# Patient Record
Sex: Male | Born: 1980
Health system: Southern US, Community
[De-identification: ages and names within clinical notes are randomized; demographics above are authoritative.]

## PROBLEM LIST (undated history)

## (undated) DIAGNOSIS — S022XXA Fracture of nasal bones, initial encounter for closed fracture: Secondary | ICD-10-CM

## (undated) HISTORY — DX: Fracture of nasal bones, initial encounter for closed fracture: S02.2XXA

## (undated) HISTORY — PX: NASAL FRACTURE SURGERY: SHX718

## (undated) HISTORY — PX: HAND SURGERY: SHX662

---

## 2018-03-10 LAB — BASIC METABOLIC PANEL
BUN: 16 (ref 4–21)
Creatinine: 1.3 (ref 0.6–1.3)
Glucose: 66
Potassium: 5.5 — AB (ref 3.4–5.3)
Sodium: 146 (ref 137–147)

## 2018-03-10 LAB — CBC AND DIFFERENTIAL
HCT: 50 (ref 41–53)
Hemoglobin: 15.8 (ref 13.5–17.5)
Neutrophils Absolute: 3
PLATELETS: 225 (ref 150–399)
WBC: 8.1

## 2018-03-10 LAB — HEPATIC FUNCTION PANEL
ALK PHOS: 89 (ref 25–125)
ALT: 34 (ref 10–40)
AST: 19 (ref 14–40)
Bilirubin, Total: 0.2

## 2018-03-10 LAB — HEMOGLOBIN A1C: Hemoglobin A1C: 5.1

## 2018-03-10 LAB — LIPID PANEL
Cholesterol: 175 (ref 0–200)
HDL: 57 (ref 35–70)
LDL Cholesterol: 89
Triglycerides: 144 (ref 40–160)

## 2018-03-10 LAB — TSH: TSH: 2.41 (ref 0.41–5.90)

## 2018-10-18 ENCOUNTER — Ambulatory Visit: Payer: Commercial Managed Care - PPO | Admitting: Family Medicine

## 2018-10-18 ENCOUNTER — Encounter: Payer: Self-pay | Admitting: Family Medicine

## 2018-10-18 VITALS — BP 120/76 | HR 58 | Temp 98.4°F | Ht 73.0 in | Wt 180.3 lb

## 2018-10-18 DIAGNOSIS — J329 Chronic sinusitis, unspecified: Secondary | ICD-10-CM | POA: Diagnosis not present

## 2018-10-18 DIAGNOSIS — J3089 Other allergic rhinitis: Secondary | ICD-10-CM | POA: Insufficient documentation

## 2018-10-18 DIAGNOSIS — Z86711 Personal history of pulmonary embolism: Secondary | ICD-10-CM

## 2018-10-18 DIAGNOSIS — J4520 Mild intermittent asthma, uncomplicated: Secondary | ICD-10-CM

## 2018-10-18 DIAGNOSIS — M5030 Other cervical disc degeneration, unspecified cervical region: Secondary | ICD-10-CM | POA: Insufficient documentation

## 2018-10-18 DIAGNOSIS — J45909 Unspecified asthma, uncomplicated: Secondary | ICD-10-CM | POA: Insufficient documentation

## 2018-10-18 DIAGNOSIS — Z79899 Other long term (current) drug therapy: Secondary | ICD-10-CM | POA: Insufficient documentation

## 2018-10-18 DIAGNOSIS — J309 Allergic rhinitis, unspecified: Secondary | ICD-10-CM | POA: Diagnosis not present

## 2018-10-18 NOTE — Progress Notes (Signed)
New patient office visit note:  Impression and Recommendations:    1. Chronic allergic rhinitis   2. Chronic sinusitis, unspecified location   3. Environmental and seasonal allergies- DUST MITES   4. Mild intermittent reactive airway disease without complication   5. Degenerative disc disease, cervical   6. Personal history of PE (pulmonary embolism)-  postoperative   7. High risk medications (not anticoagulants) long-term use-   prednisone per ENT     Hx of asthma:  -Patient has an inhaler, Breo, and singulair that he uses -Patient has multiple air purifiers within his home.  -He is stable at this time  Hx of Chronic sinusitis:  -Patient has been evaluated with ENT and Allergist in [place] and is on chronic prednisone -Patient will be referred to ENT specialist   Hx of bilateral PE -S/p surgery to left hand for surgical repair s/p fracture -anticoagulation workup completed, patient on blood thinner total of 6 months.  -No issues since.   Advised the patient to bring in labs from work (will be drawn in March 2020).   Follow up in 3-4 months to review labs   Education and routine counseling performed. Handouts provided.  Gross side effects, risk and benefits, and alternatives of medications discussed with patient.  Patient is aware that all medications have potential side effects and we are unable to predict every side effect or drug-drug interaction that may occur.  Expresses verbal understanding and consents to current therapy plan and treatment regimen.  Return for Follow-up in future for review of blood work done at [facility].  Please see AVS handed out to patient at the end of our visit for further patient instructions/ counseling done pertaining to today's office visit.    Note:  This document was prepared using Dragon voice recognition software and may include unintentional dictation errors.   This document serves as a record of services personally performed by  Thomasene Lot, DO. It was created on her behalf by Chestine Spore, a trained medical scribe. The creation of this record is based on the scribe's personal observations and the provider's statements to them.   I have reviewed the above medical documentation for accuracy and completeness and I concur.  Thomasene Lot, DO 10/20/2018 9:37 PM      ---------------------------------------------------------------------------------------------------------------------------------------------------------------------------------------------    Subjective:    Chief complaint:   Chief Complaint  Patient presents with  . Establish Care     HPI: Darrell Nguyen is a pleasant 38 y.o. male who presents to [facility] today to review their medical history with me and establish care.   I asked the patient to review their chronic problem list with me to ensure everything was updated and accurate.    All recent office visits with other providers, any medical records that patient brought in etc  - I reviewed today.     We asked pt to get [place] their medical records from [facility] providers/ specialists that they had seen within the past 3-5 years- if they are in private practice and/or do not work for [employer], [facility], [facility], [facility] or [facility] owned practice.  Told them to call their specialists to clarify this if they are not sure.   PMHx: He takes prednisone for chronic sinus infections with multiple surgeries. His ENT surgeon is in [place] and he would like a referral locally. His daughter is 42 months old and he is married. He has DDD in C6-C7. He takes [employer]  for his breathing issues (environmental seasonal allergies and RAD). He does have several air purifiers at home. He has a rescue inhaler and he does nasal rinses. 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 He has a hx of PE s/p his left hand fracture surgical repair, it  was attributed to him being taking off blood thinners early. He was on blood thinners for 6 months and he hasn't had any issues since then.   PSHx:  He has had several surgeries due to his hx of chronic sinusitis.   SHx:  He works at the [employer], which he loves. He hasn't smoked cigarettes. He consumes Bud select beer (up to 6 beers a week). He consumes 1 soda daily. He runs 3 times a week running on an elliptical for a hour. He has one brother.   FMHx:  His mother had breast cancer and his uncle had prostate cancer.    Wt Readings from Last 3 Encounters:  10/18/18 180 lb 4.8 oz (81.8 kg)   BP Readings from Last 3 Encounters:  10/18/18 120/76   Pulse Readings from Last 3 Encounters:  10/18/18 (!) 58   BMI Readings from Last 3 Encounters:  10/18/18 23.79 kg/m    Patient Care Team    Relationship Specialty Notifications Start End  Thomasene Lot, DO PCP - General Family Medicine  10/18/18     Patient Active Problem List   Diagnosis Date Noted  . Chronic allergic rhinitis 10/18/2018  . Chronic sinusitis 10/18/2018  . Environmental and seasonal allergies- DUST MITES 10/18/2018  . Reactive airway disease 10/18/2018  . Degenerative disc disease, cervical 10/18/2018  . Personal history of PE (pulmonary embolism)-  postoperative 10/18/2018  . High risk medications (not anticoagulants) long-term use-   prednisone per ENT 10/18/2018       As reported by pt:  Past Medical History:  Diagnosis Date  . Nasal fracture      Past Surgical History:  Procedure Laterality Date  . HAND SURGERY    . NASAL FRACTURE SURGERY       Family History  Problem Relation Age of Onset  . Breast cancer Mother   . Liver cancer Maternal Grandfather      Social History   Substance and Sexual Activity  Drug Use Never     Social History   Substance and Sexual Activity  Alcohol Use Yes  . Alcohol/week: 6.0 standard drinks  . Types: 6 Standard drinks or equivalent per  week     Social History   Tobacco Use  Smoking Status Never Smoker  Smokeless Tobacco Never Used     Current Meds  Medication Sig  . fluticasone furoate-vilanterol (BREO ELLIPTA) 100-25 MCG/INH AEPB Inhale 1 puff into the lungs daily.  . montelukast (SINGULAIR) 10 MG tablet Take 10 mg by mouth at bedtime.  . predniSONE (DELTASONE) 10 MG tablet Take 10 mg by mouth daily with breakfast.  . Timmothy Sours 93 MCG/ACT EXHU Place 1 spray into both nostrils 2 (two) times daily.    Allergies: Augmentin [amoxicillin-pot clavulanate]   ROS      Objective:   Blood pressure 120/76, pulse (!) 58, temperature 98.4 F (36.9 C), height 6\' 1"  (1.854 m), weight 180 lb 4.8 oz (81.8 kg), SpO2 98 %. Body mass index is 23.79 kg/m. General: Well Developed, well nourished, and in no acute distress.  Neuro: Alert and oriented x3, extra-ocular muscles intact, sensation grossly intact.  HEENT:[place]/AT, PERRLA, neck supple, No carotid bruits Skin: no gross rashes  Cardiac: Regular rate and rhythm Respiratory: Essentially clear to auscultation bilaterally. Not using accessory muscles, speaking in full sentences.  Abdominal: not grossly distended Musculoskeletal: Ambulates w/o diff, FROM * 4 ext.  Vasc: less 2 sec cap RF, warm and pink  Psych:  No HI/SI, judgement and insight good, Euthymic mood. Full Affect.    No results found for this or any previous visit (from the past 2160 hour(s)).

## 2018-10-18 NOTE — Patient Instructions (Addendum)
Great website for CADR [employer] Delivery Rate) for air purifiers: If you search Asthma and allergy + CADR ratings of air purifiers- that would be helpful   Please realize, EXERCISE IS MEDICINE!  -  American Heart Association [facility]) guidelines for exercise : If you are in good health, without any medical conditions, you should engage in 150-300 minutes of moderate intensity aerobic activity per week.  This means you should be huffing and puffing throughout your workout.   Engaging in regular exercise will improve brain function and memory, as well as improve mood, boost immune system and help with weight management.  As well as the other, more well-known effects of exercise such as decreasing blood sugar levels, decreasing blood pressure,  and decreasing bad cholesterol levels/ increasing good cholesterol levels.     -  The AHA strongly endorses consumption of a diet that contains a variety of foods from all the food categories with an emphasis on fruits and vegetables; fat-free and low-fat dairy products; cereal and grain products; legumes and nuts; and fish, poultry, and/or extra lean meats.    Excessive food intake, especially of foods high in saturated and trans fats, sugar, and salt, should be avoided.    Adequate water intake of roughly 1/2 of your weight in pounds, should equal the ounces of water per day you should drink.  So for instance, if you're 200 pounds, that would be 100 ounces of water per day.         Mediterranean Diet  Why follow it? Research shows  Those who follow the Mediterranean diet have a reduced risk of heart disease   The diet is associated with a reduced incidence of Parkinson's and Alzheimer's diseases  People following the diet may have longer life expectancies and lower rates of chronic diseases   The Dietary Guidelines for Americans recommends the Mediterranean diet as an eating plan to promote health and prevent disease  What Is the Mediterranean Diet?    Healthy eating plan based on typical foods and recipes of Mediterranean-style cooking  The diet is primarily a plant based diet; these foods should make up a majority of meals   Starches - Plant based foods should make up a majority of meals - They are an important sources of vitamins, minerals, energy, antioxidants, and fiber - Choose whole grains, foods high in fiber and minimally processed items  - Typical grain sources include wheat, oats, barley, corn, brown rice, bulgar, farro, millet, polenta, couscous  - Various types of beans include chickpeas, lentils, fava beans, black beans, white beans   Fruits  Veggies - Large quantities of antioxidant rich fruits & veggies; 6 or more servings  - Vegetables can be eaten raw or lightly drizzled with oil and cooked  - Vegetables common to the traditional Mediterranean Diet include: artichokes, arugula, beets, broccoli, brussel sprouts, cabbage, carrots, celery, collard greens, cucumbers, eggplant, kale, leeks, lemons, lettuce, mushrooms, okra, onions, peas, peppers, potatoes, pumpkin, radishes, rutabaga, shallots, spinach, sweet potatoes, turnips, zucchini - Fruits common to the Mediterranean Diet include: apples, apricots, avocados, cherries, clementines, dates, figs, grapefruits, grapes, melons, nectarines, oranges, peaches, pears, pomegranates, strawberries, tangerines  Fats - Replace butter and margarine with healthy oils, such as olive oil, canola oil, and tahini  - Limit nuts to no more than a handful a day  - Nuts include walnuts, almonds, pecans, pistachios, pine nuts  - Limit or avoid candied, honey roasted or heavily salted nuts - Olives are central to the Mediterranean  diet - can be eaten whole or used in a variety of dishes   Meats Protein - Limiting red meat: no more than a few times a month - When eating red meat: choose lean cuts and keep the portion to the size of deck of cards - Eggs: approx. 0 to 4 times a week  - Fish and lean  poultry: at least 2 a week  - Healthy protein sources include, chicken, [place], lean beef, lamb - Increase intake of seafood such as tuna, salmon, trout, mackerel, shrimp, scallops - Avoid or limit high fat processed meats such as sausage and bacon  Dairy - Include moderate amounts of low fat dairy products  - Focus on healthy dairy such as fat free yogurt, skim milk, low or reduced fat cheese - Limit dairy products higher in fat such as whole or 2% milk, cheese, ice cream  Alcohol - Moderate amounts of red wine is ok  - No more than 5 oz daily for women (all ages) and men older than age 36  - No more than 10 oz of wine daily for men younger than 2265  Other - Limit sweets and other desserts  - Use herbs and spices instead of salt to flavor foods  - Herbs and spices common to the traditional Mediterranean Diet include: basil, bay leaves, chives, cloves, cumin, fennel, garlic, lavender, marjoram, mint, oregano, parsley, pepper, rosemary, sage, savory, sumac, tarragon, thyme   Its not just a diet, its a lifestyle:   The Mediterranean diet includes lifestyle factors typical of those in the region   Foods, drinks and meals are best eaten with others and savored  Daily physical activity is important for overall good health  This could be strenuous exercise like running and aerobics  This could also be more leisurely activities such as walking, housework, yard-work, or taking the stairs  Moderation is the key; a balanced and healthy diet accommodates most foods and drinks  Consider portion sizes and frequency of consumption of certain foods   Meal Ideas & Options:   Breakfast:  o Whole wheat toast or whole wheat English muffins with peanut butter & hard boiled egg o Steel cut oats topped with apples & cinnamon and skim milk  o Fresh fruit: banana, strawberries, melon, berries, peaches  o Smoothies: strawberries, bananas, greek yogurt, peanut butter o Low fat greek yogurt with  blueberries and granola  o Egg white omelet with spinach and mushrooms o Breakfast couscous: whole wheat couscous, apricots, skim milk, cranberries   Sandwiches:  o Hummus and grilled vegetables (peppers, zucchini, squash) on whole wheat bread   o Grilled chicken on whole wheat pita with lettuce, tomatoes, cucumbers or tzatziki  o Tuna salad on whole wheat bread: tuna salad made with greek yogurt, olives, red peppers, capers, green onions o Garlic rosemary lamb pita: lamb sauted with garlic, rosemary, salt & pepper; add lettuce, cucumber, greek yogurt to pita - flavor with lemon juice and black pepper   Seafood:  o Mediterranean grilled salmon, seasoned with garlic, basil, parsley, lemon juice and black pepper o Shrimp, lemon, and spinach whole-grain pasta salad made with low fat greek yogurt  o Seared scallops with lemon orzo  o Seared tuna steaks seasoned salt, pepper, coriander topped with tomato mixture of olives, tomatoes, olive oil, minced garlic, parsley, green onions and cappers   Meats:  o Herbed greek chicken salad with kalamata olives, cucumber, feta  o Red bell peppers stuffed with spinach, bulgur, lean ground beef (  or lentils) & topped with feta   o Kebabs: skewers of chicken, tomatoes, onions, zucchini, squash  o [place] burgers: made with red onions, mint, dill, lemon juice, feta cheese topped with roasted red peppers  Vegetarian o Cucumber salad: cucumbers, artichoke hearts, celery, red onion, feta cheese, tossed in olive oil & lemon juice  o Hummus and whole grain pita points with a greek salad (lettuce, tomato, feta, olives, cucumbers, red onion) o Lentil soup with celery, carrots made with vegetable broth, garlic, salt and pepper  o Tabouli salad: parsley, bulgur, mint, scallions, cucumbers, tomato, radishes, lemon juice, olive oil, salt and pepper.

## 2018-10-22 DIAGNOSIS — J4530 Mild persistent asthma, uncomplicated: Secondary | ICD-10-CM | POA: Diagnosis not present

## 2018-10-22 DIAGNOSIS — J328 Other chronic sinusitis: Secondary | ICD-10-CM | POA: Diagnosis not present

## 2018-10-22 DIAGNOSIS — J339 Nasal polyp, unspecified: Secondary | ICD-10-CM | POA: Diagnosis not present

## 2019-01-17 DIAGNOSIS — J339 Nasal polyp, unspecified: Secondary | ICD-10-CM | POA: Insufficient documentation

## 2019-01-17 DIAGNOSIS — J329 Chronic sinusitis, unspecified: Secondary | ICD-10-CM | POA: Diagnosis not present

## 2019-01-17 DIAGNOSIS — J328 Other chronic sinusitis: Secondary | ICD-10-CM | POA: Diagnosis not present

## 2019-05-27 ENCOUNTER — Other Ambulatory Visit: Payer: Self-pay

## 2019-05-27 ENCOUNTER — Ambulatory Visit (INDEPENDENT_AMBULATORY_CARE_PROVIDER_SITE_OTHER): Payer: Commercial Managed Care - PPO | Admitting: Podiatry

## 2019-05-27 ENCOUNTER — Ambulatory Visit (INDEPENDENT_AMBULATORY_CARE_PROVIDER_SITE_OTHER): Payer: Commercial Managed Care - PPO

## 2019-05-27 VITALS — Temp 98.4°F

## 2019-05-27 DIAGNOSIS — M79672 Pain in left foot: Secondary | ICD-10-CM

## 2019-05-27 DIAGNOSIS — M722 Plantar fascial fibromatosis: Secondary | ICD-10-CM

## 2019-05-27 DIAGNOSIS — M216X9 Other acquired deformities of unspecified foot: Secondary | ICD-10-CM

## 2019-05-27 DIAGNOSIS — M79671 Pain in right foot: Secondary | ICD-10-CM

## 2019-05-27 DIAGNOSIS — M7752 Other enthesopathy of left foot: Secondary | ICD-10-CM

## 2019-05-27 NOTE — Patient Instructions (Signed)

## 2019-05-27 NOTE — Progress Notes (Signed)
  Subjective:  Patient ID: Darrell Nguyen, male    DOB: 10-08-1980,  MRN: [id number]  Chief Complaint  Patient presents with  . Foot Pain    Pt states bilateral arch weakness with left being worse than right. Pt states activity worsens it. Pt states needs high arch support to walk.    38 y.o. male presents with the above complaint. Hx as above. Has tried all sorts of inserts without relief.  Review of Systems: Negative except as noted in the HPI. Denies N/V/F/Ch.  Past Medical History:  Diagnosis Date  . Nasal fracture     Current Outpatient Medications:  .  budesonide (PULMICORT) 0.5 MG/2ML nebulizer solution, Use one vial twice daily, in sinus rinse irrigation as directed., Disp: , Rfl:  .  dupilumab (DUPIXENT) 300 MG/2ML prefilled syringe, , Disp: , Rfl:  .  fluticasone furoate-vilanterol (BREO ELLIPTA) 100-25 MCG/INH AEPB, Inhale 1 puff into the lungs daily., Disp: , Rfl:  .  montelukast (SINGULAIR) 10 MG tablet, Take 10 mg by mouth at bedtime., Disp: , Rfl:  .  oxyCODONE-acetaminophen (PERCOCET/ROXICET) 5-325 MG tablet, , Disp: , Rfl:  .  predniSONE (DELTASONE) 10 MG tablet, Take 10 mg by mouth daily with breakfast., Disp: , Rfl:  .  XHANCE 93 MCG/ACT EXHU, Place 1 spray into both nostrils 2 (two) times daily., Disp: , Rfl:   Social History   Tobacco Use  Smoking Status Never Smoker  Smokeless Tobacco Never Used    Allergies  Allergen Reactions  . Augmentin [Amoxicillin-Pot Clavulanate] Rash   Objective:   Vitals:   05/27/19 1352  Temp: 98.4 F (36.9 C)   There is no height or weight on file to calculate BMI. Constitutional Well developed. Well nourished.  Vascular Dorsalis pedis pulses palpable bilaterally. Posterior tibial pulses palpable bilaterally. Capillary refill normal to all digits.  No cyanosis or clubbing noted. Pedal hair growth normal.  Neurologic Normal speech. Oriented to person, place, and time. Epicritic sensation to light touch grossly  present bilaterally.  Dermatologic Nails well groomed and normal in appearance. No open wounds. No skin lesions.  Orthopedic: Normal joint ROM without pain or crepitus bilaterally. No visible deformities. Tender to palpation at the calcaneal tuber left. No pain with calcaneal squeeze bilaterally. Ankle ROM diminished range of motion bilaterally. Silfverskiold Test: positive bilaterally.   Radiographs: Taken and reviewed. No acute fractures or dislocations. No evidence of stress fracture.  Plantar heel spur absent. Posterior heel spur absent.   Assessment:   1. Plantar fasciitis   2. Arch pain of left foot   3. Arch pain, right   4. Equinus deformity of foot   5. Bursitis of heel, left    Plan:  Patient was evaluated and treated and all questions answered.  Plantar Fasciitis, bilaterally - XR reviewed as above.  - Educated on icing and stretching. Instructions given.  - Injection delivered to the plantar fascia as below. - DME: PF brace x2   Procedure: Injection Tendon/Ligament Location: Left plantar fascia at the glabrous junction; medial approach. Skin Prep: alcohol Injectate: 1 cc 0.5% marcaine plain, 1 cc dexamethasone phosphate, 0.5 cc kenalog 10. Disposition: Patient tolerated procedure well. Injection site dressed with a band-aid.  Return in about 3 weeks (around 06/17/2019) for Plantar fasciitis.

## 2019-05-30 ENCOUNTER — Other Ambulatory Visit: Payer: Self-pay | Admitting: Podiatry

## 2019-05-30 DIAGNOSIS — M722 Plantar fascial fibromatosis: Secondary | ICD-10-CM

## 2019-06-23 ENCOUNTER — Ambulatory Visit: Payer: Commercial Managed Care - PPO | Admitting: Podiatry

## 2019-07-08 ENCOUNTER — Other Ambulatory Visit: Payer: Self-pay

## 2019-07-08 ENCOUNTER — Ambulatory Visit (INDEPENDENT_AMBULATORY_CARE_PROVIDER_SITE_OTHER): Payer: Commercial Managed Care - PPO | Admitting: Podiatry

## 2019-07-08 DIAGNOSIS — M79671 Pain in right foot: Secondary | ICD-10-CM | POA: Diagnosis not present

## 2019-07-08 DIAGNOSIS — M79672 Pain in left foot: Secondary | ICD-10-CM

## 2019-07-08 DIAGNOSIS — M722 Plantar fascial fibromatosis: Secondary | ICD-10-CM | POA: Diagnosis not present

## 2019-07-08 NOTE — Progress Notes (Signed)
  Subjective:  Patient ID: Darrell Nguyen, male    DOB: 1981/02/08,  MRN: [id number]  Chief Complaint  Patient presents with  . Plantar Fasciitis    Pt states PF Braces are effective on left, right is uncomfortable, and pt has difficulty putting PF braces into his work shoes. Pt states his primary discomfort/weakness is in his plantar midfoot bilateral.    38 y.o. male presents with the above complaint. Hx as above.    Review of Systems: Negative except as noted in the HPI. Denies N/V/F/Ch.  Past Medical History:  Diagnosis Date  . Nasal fracture     Current Outpatient Medications:  .  budesonide (PULMICORT) 0.5 MG/2ML nebulizer solution, Use one vial twice daily, in sinus rinse irrigation as directed., Disp: , Rfl:  .  dupilumab (DUPIXENT) 300 MG/2ML prefilled syringe, , Disp: , Rfl:  .  fluticasone furoate-vilanterol (BREO ELLIPTA) 100-25 MCG/INH AEPB, Inhale 1 puff into the lungs daily., Disp: , Rfl:  .  montelukast (SINGULAIR) 10 MG tablet, Take 10 mg by mouth at bedtime., Disp: , Rfl:  .  oxyCODONE-acetaminophen (PERCOCET/ROXICET) 5-325 MG tablet, , Disp: , Rfl:  .  predniSONE (DELTASONE) 10 MG tablet, Take 10 mg by mouth daily with breakfast., Disp: , Rfl:  .  XHANCE 93 MCG/ACT EXHU, Place 1 spray into both nostrils 2 (two) times daily., Disp: , Rfl:   Social History   Tobacco Use  Smoking Status Never Smoker  Smokeless Tobacco Never Used    Allergies  Allergen Reactions  . Augmentin [Amoxicillin-Pot Clavulanate] Rash   Objective:   There were no vitals filed for this visit. There is no height or weight on file to calculate BMI. Constitutional Well developed. Well nourished.  Vascular Dorsalis pedis pulses palpable bilaterally. Posterior tibial pulses palpable bilaterally. Capillary refill normal to all digits.  No cyanosis or clubbing noted. Pedal hair growth normal.  Neurologic Normal speech. Oriented to person, place, and time. Epicritic sensation to light  touch grossly present bilaterally.  Dermatologic Nails well groomed and normal in appearance. No open wounds. No skin lesions.  Orthopedic: Normal joint ROM without pain or crepitus bilaterally. No visible deformities. No tenderness to palpation at the calcaneal tuber left. No pain with calcaneal squeeze bilaterally. Ankle ROM diminished range of motion bilaterally. Silfverskiold Test: positive bilaterally.   Radiographs: None   Assessment:   1. Plantar fasciitis   2. Arch pain of left foot   3. Arch pain, right    Plan:  Patient was evaluated and treated and all questions answered.  Plantar Fasciitis, bilaterally - Hold off further injection - Continue stretching and icing - Cast for CMOs. Will check benefits. Will need to fit in his steel toed boots.  No follow-ups on file.

## 2019-07-16 ENCOUNTER — Emergency Department (HOSPITAL_COMMUNITY): Payer: Commercial Managed Care - PPO

## 2019-07-16 ENCOUNTER — Encounter (HOSPITAL_COMMUNITY): Payer: Self-pay

## 2019-07-16 ENCOUNTER — Other Ambulatory Visit: Payer: Self-pay

## 2019-07-16 ENCOUNTER — Inpatient Hospital Stay (HOSPITAL_COMMUNITY)
Admission: EM | Admit: 2019-07-16 | Discharge: 2019-07-18 | DRG: 176 | Disposition: A | Discharge status: Home / Self Care | Payer: Commercial Managed Care - PPO | Attending: Internal Medicine | Admitting: Internal Medicine

## 2019-07-16 DIAGNOSIS — I2693 Single subsegmental pulmonary embolism without acute cor pulmonale: Secondary | ICD-10-CM | POA: Diagnosis not present

## 2019-07-16 DIAGNOSIS — I2699 Other pulmonary embolism without acute cor pulmonale: Secondary | ICD-10-CM | POA: Diagnosis present

## 2019-07-16 DIAGNOSIS — J329 Chronic sinusitis, unspecified: Secondary | ICD-10-CM | POA: Diagnosis present

## 2019-07-16 DIAGNOSIS — Z86711 Personal history of pulmonary embolism: Secondary | ICD-10-CM

## 2019-07-16 DIAGNOSIS — Z23 Encounter for immunization: Secondary | ICD-10-CM

## 2019-07-16 DIAGNOSIS — Z803 Family history of malignant neoplasm of breast: Secondary | ICD-10-CM

## 2019-07-16 DIAGNOSIS — Z7952 Long term (current) use of systemic steroids: Secondary | ICD-10-CM

## 2019-07-16 DIAGNOSIS — J4530 Mild persistent asthma, uncomplicated: Secondary | ICD-10-CM | POA: Diagnosis present

## 2019-07-16 DIAGNOSIS — Z20828 Contact with and (suspected) exposure to other viral communicable diseases: Secondary | ICD-10-CM | POA: Diagnosis present

## 2019-07-16 LAB — HIV ANTIBODY (ROUTINE TESTING W REFLEX): HIV Screen 4th Generation wRfx: NONREACTIVE

## 2019-07-16 LAB — CBC WITH DIFFERENTIAL/PLATELET
Abs Immature Granulocytes: 0.05 10*3/uL (ref 0.00–0.07)
Basophils Absolute: 0 10*3/uL (ref 0.0–0.1)
Basophils Relative: 1 %
Eosinophils Absolute: 0.4 10*3/uL (ref 0.0–0.5)
Eosinophils Relative: 4 %
HCT: 47.6 % (ref 39.0–52.0)
Hemoglobin: 16.1 g/dL (ref 13.0–17.0)
Immature Granulocytes: 1 %
Lymphocytes Relative: 15 %
Lymphs Abs: 1.3 10*3/uL (ref 0.7–4.0)
MCH: 31.1 pg (ref 26.0–34.0)
MCHC: 33.8 g/dL (ref 30.0–36.0)
MCV: 92.1 fL (ref 80.0–100.0)
Monocytes Absolute: 0.8 10*3/uL (ref 0.1–1.0)
Monocytes Relative: 9 %
Neutro Abs: 6.2 10*3/uL (ref 1.7–7.7)
Neutrophils Relative %: 70 %
Platelets: 220 10*3/uL (ref 150–400)
RBC: 5.17 MIL/uL (ref 4.22–5.81)
RDW: 13.4 % (ref 11.5–15.5)
WBC: 8.9 10*3/uL (ref 4.0–10.5)
nRBC: 0 % (ref 0.0–0.2)

## 2019-07-16 LAB — BASIC METABOLIC PANEL
Anion gap: 10 (ref 5–15)
BUN: 11 mg/dL (ref 6–20)
CO2: 24 mmol/L (ref 22–32)
Calcium: 9 mg/dL (ref 8.9–10.3)
Chloride: 106 mmol/L (ref 98–111)
Creatinine, Ser: 1.15 mg/dL (ref 0.61–1.24)
GFR calc Af Amer: >60 mL/min (ref >60)
GFR calc non Af Amer: >60 mL/min (ref >60)
Glucose, Bld: 89 mg/dL (ref 70–99)
Potassium: 3.6 mmol/L (ref 3.5–5.1)
Sodium: 140 mmol/L (ref 135–145)

## 2019-07-16 LAB — APTT: aPTT: 26 seconds (ref 24–36)

## 2019-07-16 LAB — TROPONIN I (HIGH SENSITIVITY)
Troponin I (High Sensitivity): 6 ng/L (ref <18)
Troponin I (High Sensitivity): 6 ng/L (ref <18)

## 2019-07-16 LAB — PROTIME-INR
INR: 1 (ref 0.8–1.2)
Prothrombin Time: 13 seconds (ref 11.4–15.2)

## 2019-07-16 LAB — SARS CORONAVIRUS 2 (TAT 6-24 HRS): SARS Coronavirus 2: NEGATIVE

## 2019-07-16 LAB — HEPARIN LEVEL (UNFRACTIONATED): Heparin Unfractionated: 0.87 IU/mL — ABNORMAL HIGH (ref 0.30–0.70)

## 2019-07-16 MED ORDER — HEPARIN (PORCINE) 25000 UT/250ML-% IV SOLN
1200.0000 [IU]/h | INTRAVENOUS | Status: DC
  Administered 2019-07-16: 14:00:00 1350 [IU]/h via INTRAVENOUS
  Administered 2019-07-17 – 2019-07-18 (×2): 1200 [IU]/h via INTRAVENOUS
  Filled 2019-07-16 (×3): qty 250

## 2019-07-16 MED ORDER — ACETAMINOPHEN 325 MG PO TABS: 650.0000 mg | ORAL_TABLET | Freq: Four times a day (QID) | ORAL | Status: DC | PRN

## 2019-07-16 MED ORDER — ACETAMINOPHEN 650 MG RE SUPP: 650.0000 mg | Freq: Four times a day (QID) | RECTAL | Status: DC | PRN

## 2019-07-16 MED ORDER — VITAMIN D 25 MCG (1000 UNIT) PO TABS
2000.0000 [IU] | ORAL_TABLET | Freq: Every morning | ORAL | Status: DC
  Administered 2019-07-16 – 2019-07-18 (×3): 2000 [IU] via ORAL
  Filled 2019-07-16 (×3): qty 2

## 2019-07-16 MED ORDER — MONTELUKAST SODIUM 10 MG PO TABS
10.0000 mg | ORAL_TABLET | Freq: Every morning | ORAL | Status: DC
  Administered 2019-07-16 – 2019-07-18 (×3): 10 mg via ORAL
  Filled 2019-07-16 (×3): qty 1

## 2019-07-16 MED ORDER — BUDESONIDE 0.5 MG/2ML IN SUSP: 0.5000 mg | RESPIRATORY_TRACT | Status: DC

## 2019-07-16 MED ORDER — ALBUTEROL SULFATE (2.5 MG/3ML) 0.083% IN NEBU
2.5000 mg | INHALATION_SOLUTION | Freq: Four times a day (QID) | RESPIRATORY_TRACT | Status: DC
  Filled 2019-07-16: qty 3

## 2019-07-16 MED ORDER — IOHEXOL 350 MG/ML SOLN
100.0000 mL | Freq: Once | INTRAVENOUS | Status: AC | PRN
  Administered 2019-07-16: 12:00:00 61 mL via INTRAVENOUS

## 2019-07-16 MED ORDER — ONDANSETRON HCL 4 MG PO TABS: 4.0000 mg | ORAL_TABLET | Freq: Four times a day (QID) | ORAL | Status: DC | PRN

## 2019-07-16 MED ORDER — DUPILUMAB 300 MG/2ML ~~LOC~~ SOSY: 300.0000 mg | PREFILLED_SYRINGE | SUBCUTANEOUS | Status: DC

## 2019-07-16 MED ORDER — MORPHINE SULFATE (PF) 2 MG/ML IV SOLN: 2.0000 mg | INTRAVENOUS | Status: DC | PRN

## 2019-07-16 MED ORDER — HYDROCODONE-ACETAMINOPHEN 5-325 MG PO TABS: 1.0000 | ORAL_TABLET | ORAL | Status: DC | PRN

## 2019-07-16 MED ORDER — HEPARIN BOLUS VIA INFUSION
5000.0000 [IU] | Freq: Once | INTRAVENOUS | Status: AC
  Administered 2019-07-16: 14:00:00 5000 [IU] via INTRAVENOUS
  Filled 2019-07-16: qty 5000

## 2019-07-16 MED ORDER — ONDANSETRON HCL 4 MG/2ML IJ SOLN: 4.0000 mg | Freq: Four times a day (QID) | INTRAMUSCULAR | Status: DC | PRN

## 2019-07-16 NOTE — ED Notes (Signed)
ED TO INPATIENT HANDOFF REPORT  ED Nurse Name and Phone #:  Zion Lint/Genevieve [phone]  S Name/Age/Gender Darrell Nguyen 38 y.o. male Room/Bed: 006C/006C  Code Status   Code Status: Full Code  Home/SNF/Other Home Patient oriented to: self, place, time and situation Is this baseline? Yes   Triage Complete: Triage complete  Chief Complaint back pain, hx of PE  Triage Note Patient reports mid left back pain, beneath left scapula, starting this morning around 0300. Pain feels similar to previous experience with PE, in 2017. Denies N/V/chills.    Allergies Allergies  Allergen Reactions  . Augmentin [Amoxicillin-Pot Clavulanate] Rash    Level of Care/Admitting Diagnosis ED Disposition    ED Disposition Condition Comment   Admit  Hospital Area: [facility] [[id number]]  Level of Care: Progressive [102]  Admit to Progressive based on following criteria: RESPIRATORY PROBLEMS hypoxemic/hypercapnic respiratory failure that is responsive to NIPPV (BiPAP) or High Flow Nasal Cannula (6-80 lpm). Frequent assessment/intervention, no > Q2 hrs < Q4 hrs, to maintain oxygenation and pulmonary hygiene.  Covid Evaluation: Asymptomatic Screening Protocol (No Symptoms)  Diagnosis: Pulmonary embolism (HCC) [241700]  Admitting Physician: Carron Curie [[id number]]  Attending Physician: HIJAZI, ALI Bai.Lain  Estimated length of stay: past midnight tomorrow  Certification:: I certify this patient will need inpatient services for at least 2 midnights  PT Class (Do Not Modify): Inpatient [101]  PT Acc Code (Do Not Modify): Private [1]       B Medical/Surgery History Past Medical History:  Diagnosis Date  . Nasal fracture    Past Surgical History:  Procedure Laterality Date  . HAND SURGERY    . NASAL FRACTURE SURGERY       A IV Location/Drains/Wounds Patient Lines/Drains/Airways Status   Active Line/Drains/Airways    Name:   Placement date:   Placement time:   Site:   Days:    Peripheral IV 07/16/19 Left Antecubital   07/16/19    1153    Antecubital   less than 1          Intake/Output Last 24 hours No intake or output data in the 24 hours ending 07/16/19 1449  Labs/Imaging Results for orders placed or performed during the hospital encounter of 07/16/19 (from the past 48 hour(s))  CBC with Differential     Status: None   Collection Time: 07/16/19 10:39 AM  Result Value Ref Range   WBC 8.9 4.0 - 10.5 K/uL   RBC 5.17 4.22 - 5.81 MIL/uL   Hemoglobin 16.1 13.0 - 17.0 g/dL   HCT 65.9 93.5 - 70.1 %   MCV 92.1 80.0 - 100.0 fL   MCH 31.1 26.0 - 34.0 pg   MCHC 33.8 30.0 - 36.0 g/dL   RDW 77.9 39.0 - 30.0 %   Platelets 220 150 - 400 K/uL   nRBC 0.0 0.0 - 0.2 %   Neutrophils Relative % 70 %   Neutro Abs 6.2 1.7 - 7.7 K/uL   Lymphocytes Relative 15 %   Lymphs Abs 1.3 0.7 - 4.0 K/uL   Monocytes Relative 9 %   Monocytes Absolute 0.8 0.1 - 1.0 K/uL   Eosinophils Relative 4 %   Eosinophils Absolute 0.4 0.0 - 0.5 K/uL   Basophils Relative 1 %   Basophils Absolute 0.0 0.0 - 0.1 K/uL   Immature Granulocytes 1 %   Abs Immature Granulocytes 0.05 0.00 - 0.07 K/uL    Comment: Performed at [facility], [address] [place], [place] [address]  Basic metabolic panel     Status: None   Collection Time: 07/16/19 10:39 AM  Result Value Ref Range   Sodium 140 135 - 145 mmol/L   Potassium 3.6 3.5 - 5.1 mmol/L   Chloride 106 98 - 111 mmol/L   CO2 24 22 - 32 mmol/L   Glucose, Bld 89 70 - 99 mg/dL   BUN 11 6 - 20 mg/dL   Creatinine, Ser 1.611.15 0.61 - 1.24 mg/dL   Calcium 9.0 8.9 - 09.610.3 mg/dL   GFR calc non Af Amer >60 >60 mL/min   GFR calc Af Amer >60 >60 mL/min   Anion gap 10 5 - 15    Comment: Performed at [facility], [address] [place], [place] [address]  Troponin I (High Sensitivity)     Status: None   Collection Time: 07/16/19 10:39 AM  Result Value Ref Range   Troponin I (High Sensitivity) 6 <18 ng/L    Comment: (NOTE) Elevated high  sensitivity troponin I (hsTnI) values and significant  changes across serial measurements may suggest ACS but many other  chronic and acute conditions are known to elevate hsTnI results.  Refer to the "Links" section for chest pain algorithms and additional  guidance. Performed at [facility], [address] [place], [place] [address]   Troponin I (High Sensitivity)     Status: None   Collection Time: 07/16/19 12:39 PM  Result Value Ref Range   Troponin I (High Sensitivity) 6 <18 ng/L    Comment: (NOTE) Elevated high sensitivity troponin I (hsTnI) values and significant  changes across serial measurements may suggest ACS but many other  chronic and acute conditions are known to elevate hsTnI results.  Refer to the "Links" section for chest pain algorithms and additional  guidance. Performed at [facility], [address] [place], [place] [address]   Protime-INR     Status: None   Collection Time: 07/16/19  1:17 PM  Result Value Ref Range   Prothrombin Time 13.0 11.4 - 15.2 seconds   INR 1.0 0.8 - 1.2    Comment: (NOTE) INR goal varies based on device and disease states. Performed at [facility], [address] [place], [place] [address]   APTT     Status: None   Collection Time: 07/16/19  1:17 PM  Result Value Ref Range   aPTT 26 24 - 36 seconds    Comment: Performed at [facility], [address] [place], [place] [address]   Dg Chest 2 View  Result Date: 07/16/2019 CLINICAL DATA:  38 year old male with a history of back pain EXAM: CHEST - 2 VIEW COMPARISON:  None. FINDINGS: Cardiomediastinal silhouette within normal limits in size and contour. No evidence of central vascular congestion. No pneumothorax or pleural effusion. No confluent airspace disease. No displaced fracture. IMPRESSION: Negative for acute cardiopulmonary disease Electronically Signed   By: Gilmer MorJaime  Wagner D.O.   On: 07/16/2019 11:22   Ct Angio Chest Pe W And/or Wo  Contrast  Result Date: 07/16/2019 CLINICAL DATA:  Left mid back pain.  History of PE. EXAM: CT ANGIOGRAPHY CHEST WITH CONTRAST TECHNIQUE: Multidetector CT imaging of the chest was performed using the standard protocol during bolus administration of intravenous contrast. Multiplanar CT image reconstructions and MIPs were obtained to evaluate the vascular anatomy. CONTRAST:  61mL OMNIPAQUE IOHEXOL 350 MG/ML SOLN COMPARISON:  None. FINDINGS: Cardiovascular: Heart size normal. No pericardial effusion. Satisfactory opacification of pulmonary arteries  noted, and there is no evidence of pulmonary emboli. There is good contrast opacification of the pulmonary arterial tree. There is a single segmental filling defect in posterior basal segment left lower lobe pulmonary artery consistent with acute PE. No stigmata of chronic pulmonary emboli. Adequate contrast opacification of the thoracic aorta with no evidence of dissection, aneurysm, or stenosis. There is classic 3-vessel brachiocephalic arch anatomy without proximal stenosis. No significant atheromatous change. Mediastinum/Nodes: No hilar or mediastinal adenopathy. Lungs/Pleura: No pleural effusion. No pneumothorax. Pleural-based wedge-like consolidation in the posterolateral left lower lobe possibly small pulmonary infarct. Subsegmental consolidation/atelectasis posteriorly in both lower lobes left greater than right. Lungs otherwise clear. Upper Abdomen: No acute findings Musculoskeletal: No chest wall abnormality. No acute or significant osseous findings. Review of the MIP images confirms the above findings. IMPRESSION: 1. POSITIVE for solitary segmental acute pulmonary embolus in the posterior basal segment left lower lobe, with possible small associated pulmonary infarct. Electronically Signed   By: Corlis Leak M.D.   On: 07/16/2019 12:35    Pending Labs Unresulted Labs (From admission, onward)    Start     Ordered   07/17/19 0500  Heparin level  (unfractionated)  Daily,   R     07/16/19 1342   07/17/19 0500  CBC  Daily,   R     07/16/19 1342   07/16/19 2000  Heparin level (unfractionated)  Once-Timed,   STAT     07/16/19 1432   07/16/19 1414  HIV Antibody (routine testing w rflx)  (HIV Antibody (Routine testing w reflex) panel)  Once,   STAT     07/16/19 1413   07/16/19 1336  Protein C activity  (Hypercoagulable Panel, Comprehensive (PNL))  Once,   STAT     07/16/19 1335   07/16/19 1336  Protein C, total  (Hypercoagulable Panel, Comprehensive (PNL))  Once,   STAT     07/16/19 1335   07/16/19 1336  Protein S activity  (Hypercoagulable Panel, Comprehensive (PNL))  Once,   STAT     07/16/19 1335   07/16/19 1336  Protein S, total  (Hypercoagulable Panel, Comprehensive (PNL))  Once,   STAT     07/16/19 1335   07/16/19 1336  Lupus anticoagulant panel  (Hypercoagulable Panel, Comprehensive (PNL))  Once,   STAT     07/16/19 1335   07/16/19 1336  Beta-2-glycoprotein i abs, IgG/M/A  (Hypercoagulable Panel, Comprehensive (PNL))  Once,   STAT     07/16/19 1335   07/16/19 1336  Homocysteine, serum  (Hypercoagulable Panel, Comprehensive (PNL))  Once,   STAT     07/16/19 1335   07/16/19 1336  Factor 5 leiden  (Hypercoagulable Panel, Comprehensive (PNL))  Once,   STAT     07/16/19 1335   07/16/19 1336  Prothrombin gene mutation  (Hypercoagulable Panel, Comprehensive (PNL))  Once,   STAT     07/16/19 1335   07/16/19 1336  Cardiolipin antibodies, IgG, IgM, IgA  (Hypercoagulable Panel, Comprehensive (PNL))  Once,   STAT     07/16/19 1335   07/16/19 1304  SARS CORONAVIRUS 2 (TAT 6-24 HRS) Nasopharyngeal Nasopharyngeal Swab  (Asymptomatic/Tier 2 Patients Labs)  Once,   STAT    Question Answer Comment  Is this test for diagnosis or screening Screening   Symptomatic for COVID-19 as defined by CDC No   Hospitalized for COVID-19 No   Admitted to ICU for COVID-19 No   Previously tested for COVID-19 No   Resident in a congregate (group) care  setting  No   Employed in healthcare setting No      07/16/19 1304          Vitals/Pain Today's Vitals   07/16/19 1317 07/16/19 1345 07/16/19 1430 07/16/19 1445  BP: 128/81 120/79 127/71 123/89  Pulse: 81 69 87 80  Resp: 16 20 18 13   Temp:      TempSrc:      SpO2: 96% 98% 100% 100%  Weight:      Height:      PainSc:        Isolation Precautions No active isolations  Medications Medications  heparin ADULT infusion 100 units/mL (25000 units/247mL sodium chloride 0.45%) (1,350 Units/hr Intravenous New Bag/Given 07/16/19 1419)  Vitamin D 2,000 Units (has no administration in time range)  budesonide (PULMICORT) nebulizer solution 0.5 mg (has no administration in time range)  montelukast (SINGULAIR) tablet 10 mg (has no administration in time range)  dupilumab (DUPIXENT) prefilled syringe 300 mg (has no administration in time range)  acetaminophen (TYLENOL) tablet 650 mg (has no administration in time range)    Or  acetaminophen (TYLENOL) suppository 650 mg (has no administration in time range)  HYDROcodone-acetaminophen (NORCO/VICODIN) 5-325 MG per tablet 1-2 tablet (has no administration in time range)  ondansetron (ZOFRAN) tablet 4 mg (has no administration in time range)    Or  ondansetron (ZOFRAN) injection 4 mg (has no administration in time range)  morphine 2 MG/ML injection 2 mg (has no administration in time range)  albuterol (PROVENTIL) (2.5 MG/3ML) 0.083% nebulizer solution 2.5 mg (has no administration in time range)  iohexol (OMNIPAQUE) 350 MG/ML injection 100 mL (61 mLs Intravenous Contrast Given 07/16/19 1213)  heparin bolus via infusion 5,000 Units (5,000 Units Intravenous Bolus from Bag 07/16/19 1419)    Mobility walks Low fall risk   Focused Assessments Pulmonary Assessment Handoff:  Lung sounds:   O2 Device: Room Air        R Recommendations: See Admitting Provider Note  Report given to:   Additional Notes:

## 2019-07-16 NOTE — ED Notes (Signed)
Patient returned from CT

## 2019-07-16 NOTE — Progress Notes (Signed)
ANTICOAGULATION CONSULT NOTE - Initial Consult  Pharmacy Consult for heparin Indication: pulmonary embolus   Patient Measurements: Height: 6\' 1"  (185.4 cm) Weight: 185 lb (83.9 kg) IBW/kg (Calculated) : 79.9 Heparin Dosing Weight: 83.9 kg  Vital Signs: Temp: 98.6 F (37 C) (11/07 1003) Temp Source: Oral (11/07 1003) BP: 128/81 (11/07 1317) Pulse Rate: 81 (11/07 1317)  Labs: Recent Labs    07/16/19 1039 07/16/19 1239  HGB 16.1  --   HCT 47.6  --   PLT 220  --   CREATININE 1.15  --   TROPONINIHS 6 6     Assessment: 38 yo male admitted with scapular pain. Pt has a history of a prior PE and stated this felt similar. CTA is positive for segmental PE in L lower lobe. CBC and Scr are normal. He will be placed on a heparin infusion and will undergo hypercoagulable workup.  Goal of Therapy:  Heparin level 0.3-0.7 units/ml Monitor platelets by anticoagulation protocol: Yes   Plan:  -Heparin bolus 5000 units x1 then 1350 units/hr -Daily HL, CBC -Check HL this evening   Harvel Quale 07/16/2019,1:39 PM

## 2019-07-16 NOTE — ED Notes (Signed)
Patient transported to CT 

## 2019-07-16 NOTE — ED Provider Notes (Signed)
[facility] EMERGENCY DEPARTMENT Provider Note   CSN: [id number] Arrival date & time: 07/16/19  7829    History   Chief Complaint Back Pain  HPI Darrell Nguyen is a 38 y.o. male with past medical history significant for asthma, PE who presents for evaluation of left scapular pain.  Patient states he woke up at 3:00 this morning with left-sided scapular pain he has not taken anything for this.  Patient states he has a sharp shooting pain which goes into his chest when he takes a deep breath. Does have history of PE which he thinks was thought related to a hand surgery which he had had previously.  Patient states he did have a surgery in June however none since.  He denies fever, chills, nausea, vomiting, cough, hemoptysis abdominal pain, diarrhea, dysuria, flank pain, recent injury or trauma, unilateral leg swelling, redness or warmth.  No recent anticoagulation.  He is unsure of clotting disorders.  Denies additional aggravating or alleviating factors.  Patient states pain also worse when he lays flat and improves when he sits up. Denies additional aggravating or alleviating factors.  States he has had some intermittent shortness of breath however has none currently.  She states "I am just nervous because this is what my prior blood clot felt like."  Was evaluated by telehealth earlier today and sent here for CT scan of his chest to rule out a blood clot.  Rates his current pain a 5/10.  He does not anything for pain at this time.  He describes his pain as sharp and shooting.  History obtained from patient and past medical records. No interpretor was used.   PCP- Opalski    HPI  Past Medical History:  Diagnosis Date   Nasal fracture     Patient Active Problem List   Diagnosis Date Noted   Pulmonary embolism (HCC) 07/16/2019   Nasal polyposis 01/17/2019   Chronic allergic rhinitis 10/18/2018   Chronic sinusitis 10/18/2018   Environmental and seasonal allergies-  DUST MITES 10/18/2018   Reactive airway disease 10/18/2018   Degenerative disc disease, cervical 10/18/2018   Personal history of PE (pulmonary embolism)-  postoperative 10/18/2018   High risk medications (not anticoagulants) long-term use-   prednisone per ENT 10/18/2018    Past Surgical History:  Procedure Laterality Date   HAND SURGERY     NASAL FRACTURE SURGERY          Home Medications    Prior to Admission medications   Medication Sig Start Date End Date Taking? Authorizing Provider  budesonide (PULMICORT) 0.5 MG/2ML nebulizer solution 0.5 mg See admin instructions. Mix one vial (0.5 mg) in 8 oz (240 ml) saline solution in neo med bottle and use as a saline rinse (both nostrils) every morning 01/20/19  Yes [provider]  Cholecalciferol (VITAMIN D) 50 MCG (2000 UT) tablet Take 2,000 Units by mouth every morning.   Yes [provider]  dupilumab (DUPIXENT) 300 MG/2ML prefilled syringe Inject 300 mg into the skin every 14 (fourteen) days.  03/02/19  Yes [provider]  montelukast (SINGULAIR) 10 MG tablet Take 10 mg by mouth every morning.    Yes [provider]  predniSONE (DELTASONE) 10 MG tablet Take 10 mg by mouth daily with breakfast. Continuous course   Yes [provider]    Family History Family History  Problem Relation Age of Onset   Breast cancer Mother    Liver cancer Maternal Grandfather     Social  History Social History   Tobacco Use   Smoking status: Never Smoker   Smokeless tobacco: Never Used  Substance Use Topics   Alcohol use: Yes    Alcohol/week: 6.0 standard drinks    Types: 6 Standard drinks or equivalent per week   Drug use: Never     Allergies   Augmentin [amoxicillin-pot clavulanate]   Review of Systems Review of Systems  Constitutional: Negative.   HENT: Negative.   Respiratory: Positive for shortness of breath (Intermittent, None currently). Negative for apnea, cough,  choking, chest tightness, wheezing and stridor.   Cardiovascular: Positive for chest pain (Intermittent). Negative for palpitations and leg swelling.  Gastrointestinal: Negative.   Genitourinary: Negative.   Musculoskeletal: Negative for arthralgias, back pain, gait problem, joint swelling, myalgias, neck pain and neck stiffness.       Left scapular pain  Skin: Negative.   Neurological: Negative.   All other systems reviewed and are negative.   Physical Exam Updated Vital Signs BP 123/89    Pulse 80    Temp 98.6 F (37 C) (Oral)    Resp 13    Ht  (1.854 m)    Wt 83.9 kg    SpO2 100%    BMI 24.41 kg/m   Physical Exam Vitals signs and nursing note reviewed.  Constitutional:      General: He is not in acute distress.    Appearance: He is well-developed. He is not ill-appearing, toxic-appearing or diaphoretic.  HENT:     Head: Normocephalic and atraumatic.     Nose: Nose normal.     Mouth/Throat:     Mouth: Mucous membranes are moist.     Pharynx: Oropharynx is clear.  Eyes:     Pupils: Pupils are equal, round, and reactive to light.  Neck:     Musculoskeletal: Normal range of motion and neck supple.  Cardiovascular:     Rate and Rhythm: Regular rhythm. Tachycardia present.     Pulses: Normal pulses.          Radial pulses are 2+ on the right side and 2+ on the left side.       Dorsalis pedis pulses are 2+ on the right side and 2+ on the left side.     Heart sounds: Normal heart sounds.     Comments: HR 110 in room Pulmonary:     Effort: Pulmonary effort is normal. No respiratory distress.     Breath sounds: Normal breath sounds.     Comments: Speaks in full sentences without difficulty.  Lungs clear to auscultation bilaterally. Abdominal:     General: There is no distension.     Palpations: Abdomen is soft.     Comments: Soft, nontender without rebound or guarding  Musculoskeletal: Normal range of motion.       Back:     Comments: Calves nontender bilateral without  swelling or warmth.  [name]' sign negative.  Minimal tenderness to palpation to inferior aspect of left scapula.  No overlying skin changes.  Skin:    General: Skin is warm and dry.     Comments: Brisk capillary refill.  No pallor, rashes or lesions.  Neurological:     Mental Status: He is alert.     Comments: Nerves II through XII grossly intact.     ED Treatments / Results  Labs (all labs ordered are listed, but only abnormal results are displayed) Labs Reviewed  SARS CORONAVIRUS 2 (TAT 6-24 HRS)  CBC WITH DIFFERENTIAL/PLATELET  BASIC  METABOLIC PANEL  PROTIME-INR  APTT  PROTEIN C ACTIVITY  PROTEIN C, TOTAL  PROTEIN S ACTIVITY  PROTEIN S, TOTAL  LUPUS ANTICOAGULANT PANEL  BETA-2-GLYCOPROTEIN I ABS, IGG/M/A  HOMOCYSTEINE  FACTOR 5 LEIDEN  PROTHROMBIN GENE MUTATION  CARDIOLIPIN ANTIBODIES, IGG, IGM, IGA  HIV ANTIBODY (ROUTINE TESTING W REFLEX)  HEPARIN LEVEL (UNFRACTIONATED)  TROPONIN I (HIGH SENSITIVITY)  TROPONIN I (HIGH SENSITIVITY)    EKG EKG Interpretation  Date/Time:  Saturday July 16 2019 11:15:11 EST Ventricular Rate:  78 PR Interval:    QRS Duration: 112 QT Interval:  371 QTC Calculation: 423 R Axis:   64 Text Interpretation: Sinus rhythm Borderline intraventricular conduction delay RSR' in V1 or V2, probably normal variant No prior ECG for comparison. No STEMI Confirmed by Theda Belfast ([id number]) on 07/16/2019 11:46:31 AM   Radiology Dg Chest 2 View  Result Date: 07/16/2019 CLINICAL DATA:  38 year old male with a history of back pain EXAM: CHEST - 2 VIEW COMPARISON:  None. FINDINGS: Cardiomediastinal silhouette within normal limits in size and contour. No evidence of central vascular congestion. No pneumothorax or pleural effusion. No confluent airspace disease. No displaced fracture. IMPRESSION: Negative for acute cardiopulmonary disease Electronically Signed   By: Gilmer Mor D.O.   On: 07/16/2019 11:22   Ct Angio Chest Pe W And/or Wo  Contrast  Result Date: 07/16/2019 CLINICAL DATA:  Left mid back pain.  History of PE. EXAM: CT ANGIOGRAPHY CHEST WITH CONTRAST TECHNIQUE: Multidetector CT imaging of the chest was performed using the standard protocol during bolus administration of intravenous contrast. Multiplanar CT image reconstructions and MIPs were obtained to evaluate the vascular anatomy. CONTRAST:  65mL OMNIPAQUE IOHEXOL 350 MG/ML SOLN COMPARISON:  None. FINDINGS: Cardiovascular: Heart size normal. No pericardial effusion. Satisfactory opacification of pulmonary arteries noted, and there is no evidence of pulmonary emboli. There is good contrast opacification of the pulmonary arterial tree. There is a single segmental filling defect in posterior basal segment left lower lobe pulmonary artery consistent with acute PE. No stigmata of chronic pulmonary emboli. Adequate contrast opacification of the thoracic aorta with no evidence of dissection, aneurysm, or stenosis. There is classic 3-vessel brachiocephalic arch anatomy without proximal stenosis. No significant atheromatous change. Mediastinum/Nodes: No hilar or mediastinal adenopathy. Lungs/Pleura: No pleural effusion. No pneumothorax. Pleural-based wedge-like consolidation in the posterolateral left lower lobe possibly small pulmonary infarct. Subsegmental consolidation/atelectasis posteriorly in both lower lobes left greater than right. Lungs otherwise clear. Upper Abdomen: No acute findings Musculoskeletal: No chest wall abnormality. No acute or significant osseous findings. Review of the MIP images confirms the above findings. IMPRESSION: 1. POSITIVE for solitary segmental acute pulmonary embolus in the posterior basal segment left lower lobe, with possible small associated pulmonary infarct. Electronically Signed   By: Corlis Leak M.D.   On: 07/16/2019 12:35    Procedures .Critical Care Performed by: Linwood Dibbles, PA-C Authorized by: Linwood Dibbles, PA-C   Critical  care provider statement:    Critical care time (minutes):  45   Critical care was necessary to treat or prevent imminent or life-threatening deterioration of the following conditions:  Circulatory failure (PE with pulmonary infarct)   Critical care was time spent personally by me on the following activities:  Discussions with consultants, evaluation of patient's response to treatment, examination of patient, ordering and performing treatments and interventions, ordering and review of laboratory studies, ordering and review of radiographic studies, pulse oximetry, re-evaluation of patient's condition, obtaining history from patient or surrogate and review of old  charts   (including critical care time)  Medications Ordered in ED Medications  heparin ADULT infusion 100 units/mL (25000 units/26750mL sodium chloride 0.45%) (1,350 Units/hr Intravenous New Bag/Given 07/16/19 1419)  Vitamin D 2,000 Units (has no administration in time range)  budesonide (PULMICORT) nebulizer solution 0.5 mg (has no administration in time range)  montelukast (SINGULAIR) tablet 10 mg (has no administration in time range)  dupilumab (DUPIXENT) prefilled syringe 300 mg (has no administration in time range)  acetaminophen (TYLENOL) tablet 650 mg (has no administration in time range)    Or  acetaminophen (TYLENOL) suppository 650 mg (has no administration in time range)  HYDROcodone-acetaminophen (NORCO/VICODIN) 5-325 MG per tablet 1-2 tablet (has no administration in time range)  ondansetron (ZOFRAN) tablet 4 mg (has no administration in time range)    Or  ondansetron (ZOFRAN) injection 4 mg (has no administration in time range)  morphine 2 MG/ML injection 2 mg (has no administration in time range)  albuterol (PROVENTIL) (2.5 MG/3ML) 0.083% nebulizer solution 2.5 mg (has no administration in time range)  iohexol (OMNIPAQUE) 350 MG/ML injection 100 mL (61 mLs Intravenous Contrast Given 07/16/19 1213)  heparin bolus via  infusion 5,000 Units (5,000 Units Intravenous Bolus from Bag 07/16/19 1419)    Initial Impression / Assessment and Plan / ED Course  I have reviewed the triage vital signs and the nursing notes.  Pertinent labs & imaging results that were available during my care of the patient were reviewed by me and considered in my medical decision making (see chart for details).  38 year old male appears otherwise well presents for evaluation of left-sided scapular pain.  History of PE.  He is mildly tachycardic to 110 in room.  Has had some intermittent chest pain with deep breathing.  Pain began at 3 AM this morning.  Does have some minimal tenderness to his right scapula however patient states his pain with deep breathing feels similar to his prior history of PEs.  He is unsure if he had known clotting disorder however his prior PE was during the postop period for a prior hand surgery.  Not currently on any coagulation.  His heart and lungs are clear.  His abdomen is soft, nontender with rebound or guarding.  No evidence of infectious process on exam.  He did a telehealth visit with his PCP who sent him here for a CT scan of his chest to rule out a PE.  Not anything for pain at this time.  CT scan with left PE without heart strain.  There is probable left pulmonary infarct.  Discussed with patient.  He has been tachycardic into the low 100s, high 90s throughout his stay however he is hypoxic.  Feel he will benefit for heparin and hospital admission given possible pulmonary infarct.  Will consult with [facility] for admission.  Orders for heparin placed as well as hypercoagulable panel given this is his second PE, of which current PE without risk factors.   1338: Consulted with Dr. Sharyon MedicusHijazi with [facility] who will evaluate patient for admission.  The patient appears reasonably stabilized for admission considering the current resources, flow, and capabilities available in the ED at this time, and I doubt any other [facility] requiring  further screening and/or treatment in the ED prior to admission.       Final Clinical Impressions(s) / ED Diagnoses   Final diagnoses:  Single subsegmental pulmonary embolism without acute cor pulmonale (HCC)  Pulmonary infarct [facility](HCC)    ED Discharge Orders    None  Nettie Elm, PA-C 07/16/19 1534    Tegeler, Gwenyth Allegra, MD 07/16/19 [phone]

## 2019-07-16 NOTE — Progress Notes (Signed)
ANTICOAGULATION CONSULT NOTE - Follow-Up Consult  Pharmacy Consult for heparin Indication: pulmonary embolus   Patient Measurements: Height: 6\' 1"  (185.4 cm) Weight: 185 lb (83.9 kg) IBW/kg (Calculated) : 79.9 Heparin Dosing Weight: 83.9 kg  Vital Signs: Temp: 98.5 F (36.9 C) (11/07 1558) Temp Source: Oral (11/07 1558) BP: 142/83 (11/07 1558) Pulse Rate: 72 (11/07 1558)  Labs: Recent Labs    07/16/19 1039 07/16/19 1239 07/16/19 1317 07/16/19 1959  HGB 16.1  --   --   --   HCT 47.6  --   --   --   PLT 220  --   --   --   APTT  --   --  26  --   LABPROT  --   --  13.0  --   INR  --   --  1.0  --   HEPARINUNFRC  --   --   --  0.87*  CREATININE 1.15  --   --   --   TROPONINIHS 6 6  --   --      Assessment: 38 yo male admitted with scapular pain. Pt has a history of a prior PE and stated this felt similar. CTA is positive for segmental PE in L lower lobe. CBC and Scr are normal. He will be placed on a heparin infusion and will undergo hypercoagulable workup.  Heparin level is above goal at 0.87 on 1350 units/hr.  No bleeding noted.  Goal of Therapy:  Heparin level 0.3-0.7 units/ml Monitor platelets by anticoagulation protocol: Yes   Plan:  Decrease heparin infusion to 1200 units/hr. Next heparin level at 0400 Daily heparin level and CBC  Liela Rylee, Pharm.D., BCPS Clinical Pharmacist  **Pharmacist phone directory can now be found on [email or web address] ([email or web address]).  Listed under [place].  07/16/2019 8:54 PM

## 2019-07-16 NOTE — ED Notes (Signed)
Attempted report, 4E unable to take at this time.

## 2019-07-16 NOTE — H&P (Signed)
[facility]                                                                                    Patient Demographics  Darrell Nguyen, is a 38 y.o. male  CSN: [id number]  MRN: [id number]  DOB - 12/24/1980  Admit Date - 07/16/2019  Outpatient Primary MD for the patient is Thomasene Lot, DO   With History of -  Past Medical History:  Diagnosis Date  . Nasal fracture       Past Surgical History:  Procedure Laterality Date  . HAND SURGERY    . NASAL FRACTURE SURGERY      in for   Chief Complaint  Patient presents with  . Back Pain     HPI  Darrell Nguyen  is a 38 y.o. male, with past medical history significant for for asthma and PE in the past presenting today with 1 day history of left-sided scapular pain, sharp shooting, pleuritic.  No history of shortness of breath, nausea vomiting, fever chills or cough.  Patient denies abdominal pain, diarrhea, dysuria or flank pain.  Patient reported that the pain was similar to his previous PE. Work-up in the emergency room showed an acute PE in the posterior basal segment of the left lower lobe with pulmonary infarct. His hypercoagulable profile was sent and patient was started on heparin IV. Patient relates his previous PE to a hand surgery done in the past . Denies any family history of PEs or hypercoagulable disorders   Review of Systems    In addition to the HPI above,  No Fever-chills, No Headache, No changes with Vision or hearing, No problems swallowing food or Liquids, No Cough or Shortness of Breath, No Abdominal pain, No Nausea or Vommitting, Bowel movements are regular, No Blood in stool or Urine, No dysuria, No new skin rashes or bruises, No new joints pains-aches,  No new weakness, tingling, numbness in any extremity, No recent weight gain or loss, No polyuria, polydypsia or polyphagia, No significant Mental Stressors.  A full 10 point Review of Systems was done, except as stated above, all  other Review of Systems were negative.   Social History Social History   Tobacco Use  . Smoking status: Never Smoker  . Smokeless tobacco: Never Used  Substance Use Topics  . Alcohol use: Yes    Alcohol/week: 6.0 standard drinks    Types: 6 Standard drinks or equivalent per week     Family History Family History  Problem Relation Age of Onset  . Breast cancer Mother   . Liver cancer Maternal Grandfather      Prior to Admission medications   Medication Sig Start Date End Date Taking? Authorizing Provider  budesonide (PULMICORT) 0.5 MG/2ML nebulizer solution 0.5 mg See admin instructions. Mix one vial (0.5 mg) in 8 oz (240 ml) saline solution in neo med bottle and use as a saline rinse (both nostrils) every morning 01/20/19  Yes [provider]  Cholecalciferol (VITAMIN D) 50 MCG (2000 UT) tablet Take 2,000 Units by mouth every morning.   Yes [provider]  dupilumab (DUPIXENT) 300 MG/2ML prefilled syringe Inject 300 mg into the skin  every 14 (fourteen) days.  03/02/19  Yes [provider]  montelukast (SINGULAIR) 10 MG tablet Take 10 mg by mouth every morning.    Yes [provider]  predniSONE (DELTASONE) 10 MG tablet Take 10 mg by mouth daily with breakfast. Continuous course   Yes [provider]    Allergies  Allergen Reactions  . Augmentin [Amoxicillin-Pot Clavulanate] Rash    Physical Exam  Vitals  Blood pressure 128/81, pulse 81, temperature 98.6 F (37 C), temperature source Oral, resp. rate 16, height 6\' 1"  (1.854 m), weight 83.9 kg, SpO2 96 %.  General appearance, no acute distress,, well-developed, well-nourished, mildly anxious HEENT no jaundice or pallor, no facial deviation oral thrush Neck supple, no neck vein distention Chest clear and resonant Heart normal S1-S2, no murmurs gallops or rubs Abdomen soft, nontender, bowel sounds are present Extremities no clubbing cyanosis or edema Neuro gross nonfocal,  patient moving all extremities   Data Review  CBC Recent Labs  Lab 07/16/19 1039  WBC 8.9  HGB 16.1  HCT 47.6  PLT 220  MCV 92.1  MCH 31.1  MCHC 33.8  RDW 13.4  LYMPHSABS 1.3  MONOABS 0.8  EOSABS 0.4  BASOSABS 0.0   ------------------------------------------------------------------------------------------------------------------  Chemistries  Recent Labs  Lab 07/16/19 1039  NA 140  K 3.6  CL 106  CO2 24  GLUCOSE 89  BUN 11  CREATININE 1.15  CALCIUM 9.0   ------------------------------------------------------------------------------------------------------------------ estimated creatinine clearance is 100.4 mL/min (by C-G formula based on SCr of 1.15 mg/dL). ------------------------------------------------------------------------------------------------------------------ No results for input(s): TSH, T4TOTAL, T3FREE, THYROIDAB in the last 72 hours.  Invalid input(s): FREET3   Coagulation profile Recent Labs  Lab 07/16/19 1317  INR 1.0   ------------------------------------------------------------------------------------------------------------------- No results for input(s): DDIMER in the last 72 hours. -------------------------------------------------------------------------------------------------------------------  Cardiac Enzymes No results for input(s): CKMB, TROPONINI, MYOGLOBIN in the last 168 hours.  Invalid input(s): CK ------------------------------------------------------------------------------------------------------------------ Invalid input(s): POCBNP   ---------------------------------------------------------------------------------------------------------------  Urinalysis No results found for: COLORURINE, APPEARANCEUR, LABSPEC, PHURINE, GLUCOSEU, HGBUR, BILIRUBINUR, KETONESUR, PROTEINUR, UROBILINOGEN, NITRITE,  LEUKOCYTESUR  ----------------------------------------------------------------------------------------------------------------   Imaging results:   Dg Chest 2 View  Result Date: 07/16/2019 CLINICAL DATA:  38 year old male with a history of back pain EXAM: CHEST - 2 VIEW COMPARISON:  None. FINDINGS: Cardiomediastinal silhouette within normal limits in size and contour. No evidence of central vascular congestion. No pneumothorax or pleural effusion. No confluent airspace disease. No displaced fracture. IMPRESSION: Negative for acute cardiopulmonary disease Electronically Signed   By: Gilmer MorJaime  Wagner D.O.   On: 07/16/2019 11:22   Ct Angio Chest Pe W And/or Wo Contrast  Result Date: 07/16/2019 CLINICAL DATA:  Left mid back pain.  History of PE. EXAM: CT ANGIOGRAPHY CHEST WITH CONTRAST TECHNIQUE: Multidetector CT imaging of the chest was performed using the standard protocol during bolus administration of intravenous contrast. Multiplanar CT image reconstructions and MIPs were obtained to evaluate the vascular anatomy. CONTRAST:  61mL OMNIPAQUE IOHEXOL 350 MG/ML SOLN COMPARISON:  None. FINDINGS: Cardiovascular: Heart size normal. No pericardial effusion. Satisfactory opacification of pulmonary arteries noted, and there is no evidence of pulmonary emboli. There is good contrast opacification of the pulmonary arterial tree. There is a single segmental filling defect in posterior basal segment left lower lobe pulmonary artery consistent with acute PE. No stigmata of chronic pulmonary emboli. Adequate contrast opacification of the thoracic aorta with no evidence of dissection, aneurysm, or stenosis. There is classic 3-vessel brachiocephalic arch anatomy without proximal stenosis. No significant atheromatous change. Mediastinum/Nodes: No hilar or  mediastinal adenopathy. Lungs/Pleura: No pleural effusion. No pneumothorax. Pleural-based wedge-like consolidation in the posterolateral left lower lobe possibly small  pulmonary infarct. Subsegmental consolidation/atelectasis posteriorly in both lower lobes left greater than right. Lungs otherwise clear. Upper Abdomen: No acute findings Musculoskeletal: No chest wall abnormality. No acute or significant osseous findings. Review of the MIP images confirms the above findings. IMPRESSION: 1. POSITIVE for solitary segmental acute pulmonary embolus in the posterior basal segment left lower lobe, with possible small associated pulmonary infarct. Electronically Signed   By: Lucrezia Europe M.D.   On: 07/16/2019 12:35    My personal review of EKG: Rhythm NSR, 78 bpm with interventricular conduction delay  Assessment & Plan  Pulmonary embolism/recurrent Pulmonary embolism 3 years ago status post hand surgery. No family history of hypercoagulable disorders Check hypercoagulable panel and start heparin  History of asthma Continue with nebulizer treatment  DVT Prophylaxis Heparin  AM Labs Ordered, also please review Full Orders  Family Communication:  Left a message for Spouse Kaitlyn  Code Status full  Disposition Plan: Home  Time spent in minutes : More than 40 minutes  Condition GUARDED   @SIGNATURE @

## 2019-07-16 NOTE — ED Triage Notes (Signed)
Patient reports mid left back pain, beneath left scapula, starting this morning around 0300. Pain feels similar to previous experience with PE, in 2017. Denies N/V/chills.

## 2019-07-17 DIAGNOSIS — J329 Chronic sinusitis, unspecified: Secondary | ICD-10-CM

## 2019-07-17 DIAGNOSIS — J4530 Mild persistent asthma, uncomplicated: Secondary | ICD-10-CM

## 2019-07-17 DIAGNOSIS — I2693 Single subsegmental pulmonary embolism without acute cor pulmonale: Principal | ICD-10-CM

## 2019-07-17 LAB — CBC
HCT: 47.5 % (ref 39.0–52.0)
Hemoglobin: 16.1 g/dL (ref 13.0–17.0)
MCH: 31.1 pg (ref 26.0–34.0)
MCHC: 33.9 g/dL (ref 30.0–36.0)
MCV: 91.9 fL (ref 80.0–100.0)
Platelets: 245 10*3/uL (ref 150–400)
RBC: 5.17 MIL/uL (ref 4.22–5.81)
RDW: 13.5 % (ref 11.5–15.5)
WBC: 10.7 10*3/uL — ABNORMAL HIGH (ref 4.0–10.5)
nRBC: 0 % (ref 0.0–0.2)

## 2019-07-17 LAB — PROTEIN C, TOTAL: Protein C, Total: 69 % (ref 60–150)

## 2019-07-17 LAB — HEPARIN LEVEL (UNFRACTIONATED)
Heparin Unfractionated: 0.48 IU/mL (ref 0.30–0.70)
Heparin Unfractionated: 0.55 IU/mL (ref 0.30–0.70)

## 2019-07-17 MED ORDER — ALBUTEROL SULFATE (2.5 MG/3ML) 0.083% IN NEBU: 2.5000 mg | INHALATION_SOLUTION | Freq: Four times a day (QID) | RESPIRATORY_TRACT | Status: DC | PRN

## 2019-07-17 MED ORDER — INFLUENZA VAC SPLIT QUAD 0.5 ML IM SUSY
0.5000 mL | PREFILLED_SYRINGE | INTRAMUSCULAR | Status: AC
  Administered 2019-07-18: 0.5 mL via INTRAMUSCULAR
  Filled 2019-07-17: qty 0.5

## 2019-07-17 NOTE — Progress Notes (Signed)
ANTICOAGULATION CONSULT NOTE - Follow-Up Consult  Pharmacy Consult for Heparin Indication: pulmonary embolus   Patient Measurements: Height: 6\' 1"  (185.4 cm) Weight: 185 lb (83.9 kg) IBW/kg (Calculated) : 79.9 Heparin Dosing Weight: 83.9 kg  Vital Signs: Temp: 98.1 F (36.7 C) (11/08 0329) Temp Source: Oral (11/08 0329) BP: 110/70 (11/08 0329) Pulse Rate: 61 (11/08 0329)  Labs: Recent Labs    07/16/19 1039 07/16/19 1239 07/16/19 1317 07/16/19 1959 07/17/19 0325  HGB 16.1  --   --   --  16.1  HCT 47.6  --   --   --  47.5  PLT 220  --   --   --  245  APTT  --   --  26  --   --   LABPROT  --   --  13.0  --   --   INR  --   --  1.0  --   --   HEPARINUNFRC  --   --   --  0.87* 0.48  CREATININE 1.15  --   --   --   --   TROPONINIHS 6 6  --   --   --      Assessment: 38 yo male admitted with scapular pain. Pt has a history of a prior PE and stated this felt similar. CTA is positive for segmental PE in L lower lobe. CBC and Scr are normal. He will be placed on a heparin infusion and will undergo hypercoagulable workup.  11/8 AM update:  Heparin level therapeutic x 1 after rate decrease  Goal of Therapy:  Heparin level 0.3-0.7 units/ml Monitor platelets by anticoagulation protocol: Yes   Plan:  Cont heparin at 1200 units/hr Confirmatory heparin level at [place], PharmD, [place] Pharmacist Phone: [phone]

## 2019-07-17 NOTE — Progress Notes (Signed)
ANTICOAGULATION CONSULT NOTE - Follow-Up Consult  Pharmacy Consult for Heparin Indication: pulmonary embolus   Patient Measurements: Height: 6\' 1"  (185.4 cm) Weight: 185 lb (83.9 kg) IBW/kg (Calculated) : 79.9 Heparin Dosing Weight: 83.9 kg  Vital Signs: Temp: 98.5 F (36.9 C) (11/08 1215) Temp Source: Oral (11/08 1215) BP: 117/79 (11/08 1215) Pulse Rate: 72 (11/08 1215)  Labs: Recent Labs    07/16/19 1039 07/16/19 1239 07/16/19 1317 07/16/19 1959 07/17/19 0325 07/17/19 1206  HGB 16.1  --   --   --  16.1  --   HCT 47.6  --   --   --  47.5  --   PLT 220  --   --   --  245  --   APTT  --   --  26  --   --   --   LABPROT  --   --  13.0  --   --   --   INR  --   --  1.0  --   --   --   HEPARINUNFRC  --   --   --  0.87* 0.48 0.55  CREATININE 1.15  --   --   --   --   --   TROPONINIHS 6 6  --   --   --   --      Assessment: 38 yo male admitted on 07/16/2019 with scapular pain. Patient has a history of a prior PE and stated this felt similar. CTA is positive for segmental PE in L lower lobe. Pharmacy consulted to dose heparin for PE. He will undergo hypercoagulable workup.  Heparin level 0.55 is therapeutic on heparin 1200 units/hr. CBC stable. No reported bleeding.   Goal of Therapy:  Heparin level 0.3-0.7 units/ml Monitor platelets by anticoagulation protocol: Yes   Plan:  Continue heparin 1200 units/hr  Monitor heparin level, CBC, and S/S of bleeding daily  Follow up hypercoagulable workup and plans for oral anticoagulation  Cristela Felt, PharmD PGY1 Pharmacy Resident [employer]: [phone]

## 2019-07-17 NOTE — Progress Notes (Signed)
Patient ID: Darrell Nguyen, male   DOB: 1980-09-29, 39 y.o.   MRN: [id number]  PROGRESS NOTE    Darrell Nguyen  MPN:[id number] DOB: April 29, 1981 DOA: 07/16/2019 PCP: Mellody Dance, DO    Brief Narrative: 38 year old male with past medical history nasal polyp status post nasal surgery at [facility] by Dr. Johny Shock, mild persistent asthma controlled, chronic sinusitis and previous history of pulmonary embolism admitted for 1 day history of left scapular pain which is sharp and pleuritic.  There was no shortness of breath, nausea or vomiting.  No fever or chills and no cough.  Negative for diarrhea or urinary symptoms.  Patient did admit in the ED that pain is similar to his previous pain when he had pulmonary embolism.  Patient is currently taking 10 mg of prednisone daily for chronic sinusitis/nasal surgery.  Prescription for prednisone was written by Richardean Sale at [facility].  Her phone number is [phone].  Call Sharyn Lull to clarify the need for continued prednisone prescription for patient. CT angiogram done in the ED was positive for solitary segmental acute pulmonary embolus in the posterior basal segment of left lower lobe with possible small associated pulmonary infarct. Patient is started on heparin infusion. Serum chemistry was unremarkable and CBC was only significant for mildly elevated WBC of 10.7. Patient stated that his previous PE was extensively worked up at [facility] center in [place], but he is unaware of any family factor deficiency.  Current PE is unprovoked, uncertain if related to steroid use.  Will need complete work-up for clotting/factor deficiency.    Assessment & Plan:   Active Problems:   Pulmonary embolism ([place]) Clinical problems list 1.  Acute pulmonary embolism 2.  Chronic sinusitis 3.  Mild persistent asthma  1.  Acute pulmonary embolism.  Etiology uncertain, unprovoked, likely related to steroids versus due to previous PE. Patient endorsed being worked up at [facility] in [place].  He is uninformed of the findings of that work up Is currently on Heparin gtt. Please consult heme-onc in the morning  2.  Chronic sinusitis.  Patient requesting to continue his prednisone. Prednisone was held. Contact Glean Salen at [phone] to follow-up on prednisone for the patient.  3.  Mild persistent asthma.  Not in acute exacerbation Continue with as needed albuterol      DVT prophylaxis: Heparin gtt  Code Status: Full  Family Communication: Discussed with patient.  Disposition Plan: Continue with heparin gtt.  May discharge home on oral anticoagulant  Consultants: Was unable to consult heme-onc Please consult heme-onc in the morning  Procedures: None   Antimicrobials: None   Subjective: Patient was evaluated at the bedside.  Sitting up in bed, alert and oriented x3.  Not in acute distress.  Provided most of the information. Patient requesting to know if current PE is related to his previous PE. About 30% of patient with pulmonary embolism unlikely to have recurrence of new pulmonary embolism.  Objective: Vitals:   07/17/19 0825 07/17/19 1045 07/17/19 1215 07/17/19 1700  BP: 124/75 127/85 117/79 127/88  Pulse: 72 76 72 79  Resp: 19 19 14 14   Temp: 97.8 F (36.6 C)  98.5 F (36.9 C) 99 F (37.2 C)  TempSrc: Oral  Oral Oral  SpO2: 96% 96% 98%   Weight:      Height:        Intake/Output Summary (Last 24 hours) at 07/17/2019 1804 Last data filed at 07/17/2019 1000 Gross per 24 hour  Intake 655.81 ml  Output --  Net 655.81 ml   Filed Weights   07/16/19 1025  Weight: 83.9 kg    Examination:  General exam: Appears calm and comfortable  Respiratory system: Clear to auscultation. Respiratory effort normal. Cardiovascular system: S1 & S2 heard, RRR. No JVD, murmurs, rubs, gallops or clicks. No pedal edema. Gastrointestinal system: Abdomen is nondistended, soft and nontender. No organomegaly or masses felt. Normal bowel sounds  heard. Central nervous system: Alert and oriented. No focal neurological deficits. Extremities: Symmetric 5 x 5 power. Skin: No rashes, lesions or ulcers Psychiatry: Judgement and insight appear normal. Mood & affect appropriate.     Data Reviewed: I have personally reviewed following labs and imaging studies  CBC: Recent Labs  Lab 07/16/19 1039 07/17/19 0325  WBC 8.9 10.7*  NEUTROABS 6.2  --   HGB 16.1 16.1  HCT 47.6 47.5  MCV 92.1 91.9  PLT 220 245   Basic Metabolic Panel: Recent Labs  Lab 07/16/19 1039  NA 140  K 3.6  CL 106  CO2 24  GLUCOSE 89  BUN 11  CREATININE 1.15  CALCIUM 9.0   GFR: Estimated Creatinine Clearance: 100.4 mL/min (by C-G formula based on SCr of 1.15 mg/dL). Liver Function Tests: No results for input(s): AST, ALT, ALKPHOS, BILITOT, PROT, ALBUMIN in the last 168 hours. No results for input(s): LIPASE, AMYLASE in the last 168 hours. No results for input(s): AMMONIA in the last 168 hours. Coagulation Profile: Recent Labs  Lab 07/16/19 1317  INR 1.0   Cardiac Enzymes: No results for input(s): CKTOTAL, CKMB, CKMBINDEX, TROPONINI in the last 168 hours. BNP (last 3 results) No results for input(s): PROBNP in the last 8760 hours. HbA1C: No results for input(s): HGBA1C in the last 72 hours. CBG: No results for input(s): GLUCAP in the last 168 hours. Lipid Profile: No results for input(s): CHOL, HDL, LDLCALC, TRIG, CHOLHDL, LDLDIRECT in the last 72 hours. Thyroid Function Tests: No results for input(s): TSH, T4TOTAL, FREET4, T3FREE, THYROIDAB in the last 72 hours. Anemia Panel: No results for input(s): VITAMINB12, FOLATE, FERRITIN, TIBC, IRON, RETICCTPCT in the last 72 hours. Sepsis Labs: No results for input(s): PROCALCITON, LATICACIDVEN in the last 168 hours.  Recent Results (from the past 240 hour(s))  SARS CORONAVIRUS 2 (TAT 6-24 HRS) Nasopharyngeal Nasopharyngeal Swab     Status: None   Collection Time: 07/16/19  1:17 PM   Specimen:  Nasopharyngeal Swab  Result Value Ref Range Status   SARS Coronavirus 2 NEGATIVE NEGATIVE Final    Comment: (NOTE) SARS-CoV-2 target nucleic acids are NOT DETECTED. The SARS-CoV-2 RNA is generally detectable in upper and lower respiratory specimens during the acute phase of infection. Negative results do not preclude SARS-CoV-2 infection, do not rule out co-infections with other pathogens, and should not be used as the sole basis for treatment or other patient management decisions. Negative results must be combined with clinical observations, patient history, and epidemiological information. The expected result is Negative. Fact Sheet for Patients: HairSlick.no Fact Sheet for Healthcare Providers: quierodirigir.com This test is not yet approved or cleared by the [place] FDA and  has been authorized for detection and/or diagnosis of SARS-CoV-2 by FDA under an Emergency Use Authorization (EUA). This EUA will remain  in effect (meaning this test can be used) for the duration of the COVID-19 declaration under Section 56 4(b)(1) of the Act, 21 U.S.C. section 360bbb-3(b)(1), unless the authorization is terminated or revoked sooner. Performed at [facility], [address] [place], [place] [address]  Radiology Studies: Dg Chest 2 View  Result Date: 07/16/2019 CLINICAL DATA:  38 year old male with a history of back pain EXAM: CHEST - 2 VIEW COMPARISON:  None. FINDINGS: Cardiomediastinal silhouette within normal limits in size and contour. No evidence of central vascular congestion. No pneumothorax or pleural effusion. No confluent airspace disease. No displaced fracture. IMPRESSION: Negative for acute cardiopulmonary disease Electronically Signed   By: Gilmer MorJaime  Wagner D.O.   On: 07/16/2019 11:22   Ct Angio Chest Pe W And/or Wo Contrast  Result Date: 07/16/2019 CLINICAL DATA:  Left mid back pain.  History of PE.  EXAM: CT ANGIOGRAPHY CHEST WITH CONTRAST TECHNIQUE: Multidetector CT imaging of the chest was performed using the standard protocol during bolus administration of intravenous contrast. Multiplanar CT image reconstructions and MIPs were obtained to evaluate the vascular anatomy. CONTRAST:  61mL OMNIPAQUE IOHEXOL 350 MG/ML SOLN COMPARISON:  None. FINDINGS: Cardiovascular: Heart size normal. No pericardial effusion. Satisfactory opacification of pulmonary arteries noted, and there is no evidence of pulmonary emboli. There is good contrast opacification of the pulmonary arterial tree. There is a single segmental filling defect in posterior basal segment left lower lobe pulmonary artery consistent with acute PE. No stigmata of chronic pulmonary emboli. Adequate contrast opacification of the thoracic aorta with no evidence of dissection, aneurysm, or stenosis. There is classic 3-vessel brachiocephalic arch anatomy without proximal stenosis. No significant atheromatous change. Mediastinum/Nodes: No hilar or mediastinal adenopathy. Lungs/Pleura: No pleural effusion. No pneumothorax. Pleural-based wedge-like consolidation in the posterolateral left lower lobe possibly small pulmonary infarct. Subsegmental consolidation/atelectasis posteriorly in both lower lobes left greater than right. Lungs otherwise clear. Upper Abdomen: No acute findings Musculoskeletal: No chest wall abnormality. No acute or significant osseous findings. Review of the MIP images confirms the above findings. IMPRESSION: 1. POSITIVE for solitary segmental acute pulmonary embolus in the posterior basal segment left lower lobe, with possible small associated pulmonary infarct. Electronically Signed   By: Corlis Leak  Hassell M.D.   On: 07/16/2019 12:35        Scheduled Meds:  budesonide  0.5 mg Nebulization See admin instructions   cholecalciferol  2,000 Units Oral q morning - 10a   montelukast  10 mg Oral q morning - 10a   Continuous Infusions:   heparin 1,200 Units/hr (07/17/19 0620)     LOS: 1 day    Time spent: 35 minutes    Aquilla HackerFelix O Alegria Dominique, MD [facility] Pager [phone]   If 7PM-7AM, please contact night-coverage [email or web address] Password [email or web address] 07/17/2019, 6:04 PM

## 2019-07-18 ENCOUNTER — Telehealth: Payer: Self-pay | Admitting: Hematology and Oncology

## 2019-07-18 LAB — CBC
HCT: 48.9 % (ref 39.0–52.0)
Hemoglobin: 16.5 g/dL (ref 13.0–17.0)
MCH: 31 pg (ref 26.0–34.0)
MCHC: 33.7 g/dL (ref 30.0–36.0)
MCV: 91.7 fL (ref 80.0–100.0)
Platelets: 251 10*3/uL (ref 150–400)
RBC: 5.33 MIL/uL (ref 4.22–5.81)
RDW: 13.4 % (ref 11.5–15.5)
WBC: 10.4 10*3/uL (ref 4.0–10.5)
nRBC: 0 % (ref 0.0–0.2)

## 2019-07-18 LAB — LUPUS ANTICOAGULANT PANEL
DRVVT: 27.7 s (ref 0.0–47.0)
PTT Lupus Anticoagulant: 34.7 s (ref 0.0–51.9)

## 2019-07-18 LAB — PROTEIN S, TOTAL: Protein S Ag, Total: 125 % (ref 60–150)

## 2019-07-18 LAB — PROTEIN C ACTIVITY: Protein C Activity: 108 % (ref 73–180)

## 2019-07-18 LAB — HEPARIN LEVEL (UNFRACTIONATED): Heparin Unfractionated: 0.41 IU/mL (ref 0.30–0.70)

## 2019-07-18 LAB — PROTEIN S ACTIVITY: Protein S Activity: 87 % (ref 63–140)

## 2019-07-18 MED ORDER — RIVAROXABAN (XARELTO) VTE STARTER PACK (15 & 20 MG): ORAL_TABLET | ORAL | 0 refills | Status: DC

## 2019-07-18 MED ORDER — RIVAROXABAN 15 MG PO TABS
15.0000 mg | ORAL_TABLET | Freq: Two times a day (BID) | ORAL | Status: DC
  Administered 2019-07-18: 15 mg via ORAL
  Filled 2019-07-18: qty 1

## 2019-07-18 MED ORDER — RIVAROXABAN 20 MG PO TABS: 20.0000 mg | ORAL_TABLET | Freq: Every day | ORAL | Status: DC

## 2019-07-18 MED FILL — XARELTO STARTER PACK: 15 & 20 | 30 days supply | Qty: 51 | Fill #0

## 2019-07-18 NOTE — Telephone Encounter (Signed)
I received a msg from Dr. Benay Spice and Mikey Bussing to schedule a hem appt for f/u for PE. Mr. Darrell Nguyen has been cld and scheduled to see Dr. Lorenso Courier on 11/20 at 1pm. Pt aware to arrive 15 minutes early.

## 2019-07-18 NOTE — Progress Notes (Signed)
ANTICOAGULATION CONSULT NOTE - Follow-Up Consult  Pharmacy Consult for Heparin Indication: pulmonary embolus   Patient Measurements: Height: 6\' 1"  (185.4 cm) Weight: 185 lb (83.9 kg) IBW/kg (Calculated) : 79.9 Heparin Dosing Weight: 83.9 kg  Vital Signs: Temp: 98.4 F (36.9 C) (11/09 0833) Temp Source: Oral (11/09 0833) BP: 107/67 (11/09 0833) Pulse Rate: 70 (11/09 0833)  Labs: Recent Labs    07/16/19 1039 07/16/19 1239 07/16/19 1317  07/17/19 0325 07/17/19 1206 07/18/19 0320  HGB 16.1  --   --   --  16.1  --  16.5  HCT 47.6  --   --   --  47.5  --  48.9  PLT 220  --   --   --  245  --  251  APTT  --   --  26  --   --   --   --   LABPROT  --   --  13.0  --   --   --   --   INR  --   --  1.0  --   --   --   --   HEPARINUNFRC  --   --   --    < > 0.48 0.55 0.41  CREATININE 1.15  --   --   --   --   --   --   TROPONINIHS 6 6  --   --   --   --   --    < > = values in this interval not displayed.     Assessment: 38 yo male admitted on 07/16/2019 with scapular pain. Patient has a history of a prior PE and stated this felt similar. CTA is positive for segmental PE in L lower lobe. Pharmacy consulted to dose heparin for PE. He will undergo hypercoagulable workup.  Heparin level remains therapeutic this AM, CBC wnl.    Goal of Therapy:  Heparin level 0.3-0.7 units/ml Monitor platelets by anticoagulation protocol: Yes   Plan:  Continue heparin 1200 units/hr  Monitor heparin level, CBC, and S/S of bleeding daily  Follow up hypercoagulable workup and plans for oral anticoagulation  Bertis Ruddy, PharmD Clinical Pharmacist Please check AMION for all [place] numbers 07/18/2019 8:42 AM

## 2019-07-18 NOTE — Discharge Instructions (Signed)
Information on my medicine - XARELTO (rivaroxaban)  This medication education was reviewed with me or my healthcare representative as part of my discharge preparation.  The pharmacist that spoke with me during my hospital stay was:  Bertis Ruddy, [place]? Xarelto was prescribed to treat blood clots that may have been found in the veins of your legs (deep vein thrombosis) or in your lungs (pulmonary embolism) and to reduce the risk of them occurring again.  What do you need to know about Xarelto? The starting dose is one 15 mg tablet taken TWICE daily with food for the FIRST 21 DAYS then on 08/09/2019 the dose is changed to one 20 mg tablet taken ONCE A DAY with your evening meal.  DO NOT stop taking Xarelto without talking to the health care provider who prescribed the medication.  Refill your prescription for 20 mg tablets before you run out.  After discharge, you should have regular check-up appointments with your healthcare provider that is prescribing your Xarelto.  In the future your dose may need to be changed if your kidney function changes by a significant amount.  What do you do if you miss a dose? If you are taking Xarelto TWICE DAILY and you miss a dose, take it as soon as you remember. You may take two 15 mg tablets (total 30 mg) at the same time then resume your regularly scheduled 15 mg twice daily the next day.  If you are taking Xarelto ONCE DAILY and you miss a dose, take it as soon as you remember on the same day then continue your regularly scheduled once daily regimen the next day. Do not take two doses of Xarelto at the same time.   Important Safety Information Xarelto is a blood thinner medicine that can cause bleeding. You should call your healthcare provider right away if you experience any of the following: ? Bleeding from an injury or your nose that does not stop. ? Unusual colored urine (red or dark brown) or unusual colored  stools (red or black). ? Unusual bruising for unknown reasons. ? A serious fall or if you hit your head (even if there is no bleeding).  Some medicines may interact with Xarelto and might increase your risk of bleeding while on Xarelto. To help avoid this, consult your healthcare provider or pharmacist prior to using any new prescription or non-prescription medications, including herbals, vitamins, non-steroidal anti-inflammatory drugs (NSAIDs) and supplements.  This website has more information on Xarelto: [email or web address]-benson.[email or web address]

## 2019-07-18 NOTE — Discharge Summary (Signed)
Physician Discharge Summary  Darrell Nguyen MVH:[id number] DOB: 1981-05-27 DOA: 07/16/2019  PCP: Darrell Lot, DO  Admit date: 07/16/2019 Discharge date: 07/18/2019  Admitted From: Home Disposition: Home  Recommendations for Outpatient Follow-up:  1. Follow up with PCP in 1-2 weeks 2. Please obtain BMP/CBC in one week 3. Hematology to contact the patient and schedule outpatient follow-up appointment  Home Health: No Equipment/Devices: None  Discharge Condition: Stable CODE STATUS: Full Diet recommendation: Regular  Brief/Interim Summary: 38 year old male with past medical history nasal polyp status post nasal surgery at [facility] by Darrell Nguyen, mild persistent asthma controlled, chronic sinusitis and previous history of pulmonary embolism admitted for 1 day history of left scapular pain which is sharp and pleuritic.  There was no shortness of breath, nausea or vomiting.  No fever or chills and no cough.  Negative for diarrhea or urinary symptoms.  Patient did admit in the ED that pain is similar to his previous pain when he had pulmonary embolism.  Patient is currently taking 10 mg of prednisone daily for chronic sinusitis/nasal surgery.  Prescription for prednisone was written by Darrell Nguyen at [facility].  Her phone number is [phone].  Call Darrell Nguyen to clarify the need for continued prednisone prescription for patient.  CT angiogram done in the ED was positive for solitary segmental acute pulmonary embolus in the posterior basal segment of left lower lobe with possible small associated pulmonary infarct.  Patient was thus started on a heparin drip.  Patient stated that his previous PE was extensively worked up at [facility] center in [place], but he is unaware of any family factor deficiency.  Current PE is unprovoked, uncertain if related to steroid use.  He was discharged on Xarelto as he had been on that in the past with his previous PE in 2017 following hand surgery.  Hematology was  contacted and plan to arrange outpatient follow-up for this patient.  Discharge Diagnoses:  Active Problems:   Pulmonary embolism (HCC)  1.  Acute pulmonary embolism.  Etiology uncertain, unprovoked, possibly related to steroids versus due to previous PE. Patient endorsed being worked up at [facility] in [place].  He is uninformed of the findings of that work up Initially was on heparin drip and then transitioned to oral Xarelto on day of discharge Patient to follow-up with hematology as outpatient  2.  Chronic sinusitis.  3.  Mild persistent asthma.  Not in acute exacerbation Continue with as needed albuterol   Discharge Instructions: Continue Xarelto as instructed and follow-up with hematology as an outpatient for further work-up of unprovoked PE  Discharge Instructions    Diet - low sodium heart healthy   Complete by: As directed    Increase activity slowly   Complete by: As directed    Schedule Transitional Care Management Visit   Complete by: As directed    PCP follow up in 1-2 weeks for seemingly unprovoked pulmonary embolism     Allergies as of 07/18/2019      Reactions   Augmentin [amoxicillin-pot Clavulanate] Rash      Medication List    TAKE these medications   budesonide 0.5 MG/2ML nebulizer solution Commonly known as: PULMICORT 0.5 mg See admin instructions. Mix one vial (0.5 mg) in 8 oz (240 ml) saline solution in neo med bottle and use as a saline rinse (both nostrils) every morning   Dupixent 300 MG/2ML prefilled syringe Generic drug: dupilumab Inject 300 mg into the skin every 14 (fourteen) days.   montelukast 10  MG tablet Commonly known as: SINGULAIR Take 10 mg by mouth every morning.   predniSONE 10 MG tablet Commonly known as: DELTASONE Take 10 mg by mouth daily with breakfast. Continuous course   Rivaroxaban 15 & 20 MG Tbpk Follow package directions: Take one  tablet by mouth twice a day. On day 22, switch to one  tablet once a  day. Take with food.   Vitamin D 50 MCG (2000 UT) tablet Take 2,000 Units by mouth every morning.       Allergies  Allergen Reactions  . Augmentin [Amoxicillin-Pot Clavulanate] Rash    Consultations:  None   Procedures/Studies: Dg Chest 2 View  Result Date: 07/16/2019 CLINICAL DATA:  38 year old male with a history of back pain EXAM: CHEST - 2 VIEW COMPARISON:  None. FINDINGS: Cardiomediastinal silhouette within normal limits in size and contour. No evidence of central vascular congestion. No pneumothorax or pleural effusion. No confluent airspace disease. No displaced fracture. IMPRESSION: Negative for acute cardiopulmonary disease Electronically Signed   By: Darrell Nguyen D.O.   On: 07/16/2019 11:22   Ct Angio Chest Pe W And/or Wo Contrast  Result Date: 07/16/2019 CLINICAL DATA:  Left mid back pain.  History of PE. EXAM: CT ANGIOGRAPHY CHEST WITH CONTRAST TECHNIQUE: Multidetector CT imaging of the chest was performed using the standard protocol during bolus administration of intravenous contrast. Multiplanar CT image reconstructions and MIPs were obtained to evaluate the vascular anatomy. CONTRAST:  61mL OMNIPAQUE IOHEXOL 350 MG/ML SOLN COMPARISON:  None. FINDINGS: Cardiovascular: Heart size normal. No pericardial effusion. Satisfactory opacification of pulmonary arteries noted, and there is no evidence of pulmonary emboli. There is good contrast opacification of the pulmonary arterial tree. There is a single segmental filling defect in posterior basal segment left lower lobe pulmonary artery consistent with acute PE. No stigmata of chronic pulmonary emboli. Adequate contrast opacification of the thoracic aorta with no evidence of dissection, aneurysm, or stenosis. There is classic 3-vessel brachiocephalic arch anatomy without proximal stenosis. No significant atheromatous change. Mediastinum/Nodes: No hilar or mediastinal adenopathy. Lungs/Pleura: No pleural effusion. No pneumothorax.  Pleural-based wedge-like consolidation in the posterolateral left lower lobe possibly small pulmonary infarct. Subsegmental consolidation/atelectasis posteriorly in both lower lobes left greater than right. Lungs otherwise clear. Upper Abdomen: No acute findings Musculoskeletal: No chest wall abnormality. No acute or significant osseous findings. Review of the MIP images confirms the above findings. IMPRESSION: 1. POSITIVE for solitary segmental acute pulmonary embolus in the posterior basal segment left lower lobe, with possible small associated pulmonary infarct. Electronically Signed   By: Corlis Leak M.D.   On: 07/16/2019 12:35   (Echo, Carotid, EGD, Colonoscopy, ERCP)    Subjective: Patient is feeling well today and states his pleuritic chest pain has improved significantly.  He is continue to ambulate throughout the units and his vital signs have been stable.  Patient denies shortness of breath at rest or cough.  Discharge Exam: Vitals:   07/18/19 0833 07/18/19 1239  BP: 107/67 137/86  Pulse: 70 87  Resp: 16 12  Temp: 98.4 F (36.9 C) 98.2 F (36.8 C)  SpO2: 93% 96%   Vitals:   07/18/19 0010 07/18/19 0447 07/18/19 0833 07/18/19 1239  BP: 117/72 110/70 107/67 137/86  Pulse: 77 72 70 87  Resp: 20 (!) Temp: 99 F (37.2 C) 98.6 F (37 C) 98.4 F (36.9 C) 98.2 F (36.8 C)  TempSrc: Oral Oral Oral Oral  SpO2: 96% 95% 93% 96%  Weight:  Height:        General: Pt is alert, awake, not in acute distress Cardiovascular: RRR, S1/S2 +, no rubs, no gallops Respiratory: CTA bilaterally, no wheezing, no rhonchi Abdominal: Soft, NT, ND, bowel sounds + Extremities: no edema, no cyanosis    The results of significant diagnostics from this hospitalization (including imaging, microbiology, ancillary and laboratory) are listed below for reference.     Microbiology: Recent Results (from the past 240 hour(s))  SARS CORONAVIRUS 2 (TAT 6-24 HRS) Nasopharyngeal Nasopharyngeal  Swab     Status: None   Collection Time: 07/16/19  1:17 PM   Specimen: Nasopharyngeal Swab  Result Value Ref Range Status   SARS Coronavirus 2 NEGATIVE NEGATIVE Final    Comment: (NOTE) SARS-CoV-2 target nucleic acids are NOT DETECTED. The SARS-CoV-2 RNA is generally detectable in upper and lower respiratory specimens during the acute phase of infection. Negative results do not preclude SARS-CoV-2 infection, do not rule out co-infections with other pathogens, and should not be used as the sole basis for treatment or other patient management decisions. Negative results must be combined with clinical observations, patient history, and epidemiological information. The expected result is Negative. Fact Sheet for Patients: HairSlick.nohttps://www.fda.gov/media/138098/download Fact Sheet for Healthcare Providers: quierodirigir.comhttps://www.fda.gov/media/138095/download This test is not yet approved or cleared by the [place] FDA and  has been authorized for detection and/or diagnosis of SARS-CoV-2 by FDA under an Emergency Use Authorization (EUA). This EUA will remain  in effect (meaning this test can be used) for the duration of the COVID-19 declaration under Section 56 4(b)(1) of the Act, 21 U.S.C. section 360bbb-3(b)(1), unless the authorization is terminated or revoked sooner. Performed at [facility], [address] [place], [place] [address]      Labs: BNP (last 3 results) No results for input(s): BNP in the last 8760 hours. Basic Metabolic Panel: Recent Labs  Lab 07/16/19 1039  NA 140  K 3.6  CL 106  CO2 24  GLUCOSE 89  BUN 11  CREATININE 1.15  CALCIUM 9.0   Liver Function Tests: No results for input(s): AST, ALT, ALKPHOS, BILITOT, PROT, ALBUMIN in the last 168 hours. No results for input(s): LIPASE, AMYLASE in the last 168 hours. No results for input(s): AMMONIA in the last 168 hours. CBC: Recent Labs  Lab 07/16/19 1039 07/17/19 0325 07/18/19 0320  WBC 8.9 10.7* 10.4   NEUTROABS 6.2  --   --   HGB 16.1 16.1 16.5  HCT 47.6 47.5 48.9  MCV 92.1 91.9 91.7  PLT 220 245 251   Cardiac Enzymes: No results for input(s): CKTOTAL, CKMB, CKMBINDEX, TROPONINI in the last 168 hours. BNP: Invalid input(s): POCBNP CBG: No results for input(s): GLUCAP in the last 168 hours. D-Dimer No results for input(s): DDIMER in the last 72 hours. Hgb A1c No results for input(s): HGBA1C in the last 72 hours. Lipid Profile No results for input(s): CHOL, HDL, LDLCALC, TRIG, CHOLHDL, LDLDIRECT in the last 72 hours. Thyroid function studies No results for input(s): TSH, T4TOTAL, T3FREE, THYROIDAB in the last 72 hours.  Invalid input(s): FREET3 Anemia work up No results for input(s): VITAMINB12, FOLATE, FERRITIN, TIBC, IRON, RETICCTPCT in the last 72 hours. Urinalysis No results found for: COLORURINE, APPEARANCEUR, LABSPEC, PHURINE, GLUCOSEU, HGBUR, BILIRUBINUR, KETONESUR, PROTEINUR, UROBILINOGEN, NITRITE, LEUKOCYTESUR Sepsis Labs Invalid input(s): PROCALCITONIN,  WBC,  LACTICIDVEN Microbiology Recent Results (from the past 240 hour(s))  SARS CORONAVIRUS 2 (TAT 6-24 HRS) Nasopharyngeal Nasopharyngeal Swab     Status: None   Collection Time:  07/16/19  1:17 PM   Specimen: Nasopharyngeal Swab  Result Value Ref Range Status   SARS Coronavirus 2 NEGATIVE NEGATIVE Final    Comment: (NOTE) SARS-CoV-2 target nucleic acids are NOT DETECTED. The SARS-CoV-2 RNA is generally detectable in upper and lower respiratory specimens during the acute phase of infection. Negative results do not preclude SARS-CoV-2 infection, do not rule out co-infections with other pathogens, and should not be used as the sole basis for treatment or other patient management decisions. Negative results must be combined with clinical observations, patient history, and epidemiological information. The expected result is Negative. Fact Sheet for Patients: SugarRoll.be Fact Sheet  for Healthcare Providers: https://www.woods-mathews.com/ This test is not yet approved or cleared by the [place] FDA and  has been authorized for detection and/or diagnosis of SARS-CoV-2 by FDA under an Emergency Use Authorization (EUA). This EUA will remain  in effect (meaning this test can be used) for the duration of the COVID-19 declaration under Section 56 4(b)(1) of the Act, 21 U.S.C. section 360bbb-3(b)(1), unless the authorization is terminated or revoked sooner. Performed at [facility], [place] [address], [place], [place] [address]      Time coordinating discharge: Over 30 minutes  SIGNED:   Charolotte Capuchin, MD  [facility] 07/18/2019, 3:14 PM   If 7PM-7AM, please contact night-coverage [email or web address] Password [email or web address]

## 2019-07-18 NOTE — TOC Benefit Eligibility Note (Signed)
Transition of Care [facility]) Benefit Eligibility Note    Patient Details  Name: Cree Kunert MRN: [id number] Date of Birth: 1980/12/03   Medication/Dose: Xarelto 20 mg daily- thanks  Covered?: Yes  Tier: Other(4)  Prescription Coverage [facility]erred [facility]  Spoke with Person/Company/Phone Number:: Phillip Heal / [phone] [facility] [employer]  Co-Pay: No copayment required  Prior Approval: No  Deductible: Met       Orbie Pyo Phone Number: 07/18/2019, 2:38 PM

## 2019-07-19 LAB — BETA-2-GLYCOPROTEIN I ABS, IGG/M/A
Beta-2 Glyco I IgG: <9 GPI IgG units (ref 0–20)
Beta-2-Glycoprotein I IgA: <9 GPI IgA units (ref 0–25)
Beta-2-Glycoprotein I IgM: <9 GPI IgM units (ref 0–32)

## 2019-07-19 LAB — CARDIOLIPIN ANTIBODIES, IGG, IGM, IGA
Anticardiolipin IgA: <9 APL U/mL (ref 0–11)
Anticardiolipin IgG: <9 GPL U/mL (ref 0–14)
Anticardiolipin IgM: <9 MPL U/mL (ref 0–12)

## 2019-07-20 LAB — FACTOR 5 LEIDEN

## 2019-07-20 LAB — HOMOCYSTEINE: Homocysteine: 204.8 umol/L — ABNORMAL HIGH (ref 0.0–14.5)

## 2019-07-20 LAB — PROTHROMBIN GENE MUTATION

## 2019-07-22 ENCOUNTER — Telehealth: Payer: Self-pay | Admitting: Podiatry

## 2019-07-22 NOTE — Telephone Encounter (Signed)
Left message for pt to call to discuss orthotic coverage. I am not able to speak to anyone at the insurance company and I was able to get a fax with some benefit information but does not give me information about if pt has met deductible or if we are in network with plan.  °

## 2019-07-26 NOTE — Telephone Encounter (Signed)
Pt returned call and is aware of benefits because he called and was able to speak to someone at his insurance. Per Pt he has met deductible and out of pocket and would like to proceed with the orthotics.

## 2019-07-26 NOTE — Telephone Encounter (Signed)
Noted thanks °

## 2019-07-28 NOTE — Progress Notes (Signed)
[facility] [phone]   Fax:([phone]  INITIAL CONSULT NOTE  Patient Care Team: Thomasene Lot, DO as PCP - General (Family Medicine)  Hematological/Oncological History # Recurrent Pulmonary Embolism 1) 2017--diagnosed with pulmonary embolism following hand surgery 2) 07/16/2019: presented with sharp left scapular pain. CT PE study showed solitary segmental acute pulmonary embolus in the posterior basal segment left lower lobe, with possible small associated pulmonary infarct. No LE Dopplers performed. Started on Xarelto PO therapy.  3) 07/28/2019: establish care with Dr. Leonides Schanz   Prior Workup Includes:  07/16/2019:  Negative Factor V Leiden Negative Prothrombin Gene Mutation Undetectable Beta-2-Glycoprotein/Anticardiolipin antibodies. Negative Lupus anticoagulant panel Protein C and S levels within normal limits Homocysteine 204.8 (nml 0-14.5)   CHIEF COMPLAINTS/PURPOSE OF CONSULTATION:  Recurrent pulmonary emboli  HISTORY OF PRESENTING ILLNESS:  Darrell Nguyen 38 y.o. male with medical history significant for chronic sinusitis who presents for recurrent pulmonary embolism.  On review of prior records, Mr. Ty Hilts previously had a PE diagnosed in 2017 after hand surgery. This was worked up at [facility] in [place]. Most recently he was admitted to [facility] due to sharp left scapular pain. CT PE study showed solitary segmental acute pulmonary embolus in the posterior basal segment left lower lobe, with possible small associated pulmonary infarct. Started on Xarelto PO therapy.   On exam today Mr. Schmuck notes that he feels much better.  He reports that he initially presented with severe left shoulder pain on the morning of November 7.  He presented to the emergency department almost immediately because he had known these symptoms from his prior PE in 2017.  The pain in his chest dissipated 48 hours after initiation of anticoagulation  therapy.  He notes he currently has no shortness of breath, chest pain, cough, or lower extremity swelling.  He is overall active at baseline.  On further discussion he notes he has no issues with nosebleeds, bruising, or overt signs of bleeding.  He denies any dark stools.  He was also on Xarelto for 6 months during his last VTE episode and notes he had no problems with the medication during that time.  A full 10 point ROS is listed below.  MEDICAL HISTORY:  Past Medical History:  Diagnosis Date  . Nasal fracture     SURGICAL HISTORY: Past Surgical History:  Procedure Laterality Date  . HAND SURGERY    . NASAL FRACTURE SURGERY      SOCIAL HISTORY: Social History   Socioeconomic History  . Marital status: Married    Spouse name: Not on file  . Number of children: Not on file  . Years of education: Not on file  . Highest education level: Not on file  Occupational History  . Not on file  Social Needs  . Financial resource strain: Not on file  . Food insecurity    Worry: Not on file    Inability: Not on file  . Transportation needs    Medical: Not on file    Non-medical: Not on file  Tobacco Use  . Smoking status: Never Smoker  . Smokeless tobacco: Never Used  Substance and Sexual Activity  . Alcohol use: Yes    Alcohol/week: 6.0 standard drinks    Types: 6 Standard drinks or equivalent per week  . Drug use: Never  . Sexual activity: Yes    Birth control/protection: None  Lifestyle  . Physical activity    Days per week: Not on file    Minutes  per session: Not on file  . Stress: Not on file  Relationships  . Social Herbalist on phone: Not on file    Gets together: Not on file    Attends religious service: Not on file    Active member of club or organization: Not on file    Attends meetings of clubs or organizations: Not on file    Relationship status: Not on file  . Intimate partner violence    Fear of current or ex partner: Not on file    Emotionally  abused: Not on file    Physically abused: Not on file    Forced sexual activity: Not on file  Other Topics Concern  . Not on file  Social History Narrative  . Not on file    FAMILY HISTORY: Family History  Problem Relation Age of Onset  . Breast cancer Mother   . Liver cancer Maternal Grandfather     ALLERGIES:  is allergic to augmentin [amoxicillin-pot clavulanate].  MEDICATIONS:  Current Outpatient Medications  Medication Sig Dispense Refill  . budesonide (PULMICORT) 0.5 MG/2ML nebulizer solution 0.5 mg See admin instructions. Mix one vial (0.5 mg) in 8 oz (240 ml) saline solution in neo med bottle and use as a saline rinse (both nostrils) every morning    . Cholecalciferol (VITAMIN D) 50 MCG (2000 UT) tablet Take 2,000 Units by mouth every morning.    . dupilumab (DUPIXENT) 300 MG/2ML prefilled syringe Inject 300 mg into the skin every 14 (fourteen) days.     . montelukast (SINGULAIR) 10 MG tablet Take 10 mg by mouth every morning.     . predniSONE (DELTASONE) 10 MG tablet Take 10 mg by mouth daily with breakfast. Continuous course    . Rivaroxaban 15 & 20 MG TBPK Follow package directions: Take one 15mg  tablet by mouth twice a day. On day 22, switch to one 20mg  tablet once a day. Take with food. 51 each 0   No current facility-administered medications for this visit.     REVIEW OF SYSTEMS:   Constitutional: ( - ) fevers, ( - )  chills , ( - ) night sweats Eyes: ( - ) blurriness of vision, ( - ) double vision, ( - ) watery eyes Ears, nose, mouth, throat, and face: ( - ) mucositis, ( - ) sore throat Respiratory: ( - ) cough, ( - ) dyspnea, ( - ) wheezes Cardiovascular: ( - ) palpitation, ( - ) chest discomfort, ( - ) lower extremity swelling Gastrointestinal:  ( - ) nausea, ( - ) heartburn, ( - ) change in bowel habits Skin: ( - ) abnormal skin rashes Lymphatics: ( - ) new lymphadenopathy, ( - ) easy bruising Neurological: ( - ) numbness, ( - ) tingling, ( - ) new weaknesses  Behavioral/Psych: ( - ) mood change, ( - ) new changes  All other systems were reviewed with the patient and are negative.  PHYSICAL EXAMINATION: ECOG PERFORMANCE STATUS: 0 - Asymptomatic  Vitals:   07/29/19 1305  BP: 116/79  Pulse: 92  Resp: 18  Temp: 98 F (36.7 C)  SpO2: 99%   Filed Weights   07/29/19 1305  Weight: 187 lb 12.8 oz (85.2 kg)    GENERAL: well appearing middle aged Caucasian male in NAD  SKIN: skin color, texture, turgor are normal, no rashes or significant lesions EYES: conjunctiva are pink and non-injected, sclera clear LUNGS: clear to auscultation and percussion with normal breathing effort HEART: regular  rate & rhythm and no murmurs and no lower extremity edema ABDOMEN: soft, non-tender, non-distended, normal bowel sounds Musculoskeletal: no cyanosis of digits and no clubbing  PSYCH: alert & oriented x 3, fluent speech NEURO: no focal motor/sensory deficits  LABORATORY DATA:  I have reviewed the data as listed Lab Results  Component Value Date   WBC 10.4 07/18/2019   HGB 16.5 07/18/2019   HCT 48.9 07/18/2019   MCV 91.7 07/18/2019   PLT 251 07/18/2019   NEUTROABS 6.2 07/16/2019    PATHOLOGY: None to review.   RADIOGRAPHIC STUDIES: I have personally reviewed the radiological images as listed and agreed with the findings in the report: Agree with CTA study: LLL PE Dg Chest 2 View  Result Date: 07/16/2019 CLINICAL DATA:  38 year old male with a history of back pain EXAM: CHEST - 2 VIEW COMPARISON:  None. FINDINGS: Cardiomediastinal silhouette within normal limits in size and contour. No evidence of central vascular congestion. No pneumothorax or pleural effusion. No confluent airspace disease. No displaced fracture. IMPRESSION: Negative for acute cardiopulmonary disease Electronically Signed   By: Gilmer MorJaime  Wagner D.O.   On: 07/16/2019 11:22   Ct Angio Chest Pe W And/or Wo Contrast  Result Date: 07/16/2019 CLINICAL DATA:  Left mid back pain.  History  of PE. EXAM: CT ANGIOGRAPHY CHEST WITH CONTRAST TECHNIQUE: Multidetector CT imaging of the chest was performed using the standard protocol during bolus administration of intravenous contrast. Multiplanar CT image reconstructions and MIPs were obtained to evaluate the vascular anatomy. CONTRAST:  61mL OMNIPAQUE IOHEXOL 350 MG/ML SOLN COMPARISON:  None. FINDINGS: Cardiovascular: Heart size normal. No pericardial effusion. Satisfactory opacification of pulmonary arteries noted, and there is no evidence of pulmonary emboli. There is good contrast opacification of the pulmonary arterial tree. There is a single segmental filling defect in posterior basal segment left lower lobe pulmonary artery consistent with acute PE. No stigmata of chronic pulmonary emboli. Adequate contrast opacification of the thoracic aorta with no evidence of dissection, aneurysm, or stenosis. There is classic 3-vessel brachiocephalic arch anatomy without proximal stenosis. No significant atheromatous change. Mediastinum/Nodes: No hilar or mediastinal adenopathy. Lungs/Pleura: No pleural effusion. No pneumothorax. Pleural-based wedge-like consolidation in the posterolateral left lower lobe possibly small pulmonary infarct. Subsegmental consolidation/atelectasis posteriorly in both lower lobes left greater than right. Lungs otherwise clear. Upper Abdomen: No acute findings Musculoskeletal: No chest wall abnormality. No acute or significant osseous findings. Review of the MIP images confirms the above findings. IMPRESSION: 1. POSITIVE for solitary segmental acute pulmonary embolus in the posterior basal segment left lower lobe, with possible small associated pulmonary infarct. Electronically Signed   By: Corlis Leak  Hassell M.D.   On: 07/16/2019 12:35    ASSESSMENT & PLAN Arville Goravis Takach 38 y.o. male with medical history significant for chronic sinusitis who presents for recurrent pulmonary embolism.  His initial pulmonary embolism in 2017 was secondary to  hand surgery.  He successfully completed 6 months of anticoagulation therapy which was appropriate treatment at that time.  His most recent thromboembolism was not provoked, however he knew the signs and symptoms of pulmonary embolism due to his last episode and presented to the ED for further evaluation.   An extensive hypercoagulation work-up was performed on this patient.  Virtually all of the work-up was negative save for a markedly elevated homocysteine at 204.  He was appropriately started on Xarelto therapy and is currently in the 15 mg twice daily phase of treatment.  He has completed 10 days of this and will  go for an additional 11 before transition to 20 mg p.o. daily.  Elevated levels of homocystine are considered a risk factor for development of thromboembolism, including MI and PE.  Unfortunately there is no particular intervention that is appropriate for this treatment.  Prior studies have shown that increasing levels of B vitamins and folate may decrease the homocystine however do not decrease the rates of recurrence of VTE (Blood. 2007 Jan 1;109(1):139-44.).  Unfortunately in this situation given the unprovoked PE we recommend indefinite anticoagulation.  Further discussed 3 primary treatments including Xarelto, apixaban, and Coumadin therapy.  After discussing the risks and benefits of all 3 therapies the patient and I were in agreement that Xarelto is the best choice for him at this time.  Plan for him to be seen back in clinic in approximately 6 months to reevaluate.  #Recurrent Pulmonary Embolism #Elevated Homocysteine --continue Xarelto therapy. Given this unprovoked 2nd pulmonary embolism, our recommendation will be for lifelong anticoagulation.  --hypercoagulation workup was negative, with the exception of elevated homocysteine. --unfortunately if hyperhomocysteinemia is the etiology, administration of B vitamins and folate do not decrease the risk of recurrent CVA, MI or thrombus.  --hold on sending an MTHFR panel to work up of hyperhomocysteinemia, because the results of this panel will not change management  --RTC in 6 months to re-evaluate.   *Uses [facility]: [phone], Fax [phone]. We will refill his Xarelto prescriptions as needed.   All questions were answered. The patient knows to call the clinic with any problems, questions or concerns.  A total of more than 45 minutes were spent face-to-face with the patient during this encounter and over half of that time was spent on counseling and coordination of care as outlined above.   Ulysees Barns, MD Department of Hematology/Oncology [facility] Phone: [phone] Pager: [phone] Email: [email or web address]  07/29/2019 2:16 PM   Literature Support:  Nicholes Mango M, Willems HP, [place], Gerrits WB, [place], Eichinger S, Rosendaal FR, Bos GM. Homocysteine lowering by B vitamins and the secondary prevention of deep vein thrombosis and pulmonary embolism: A randomized, placebo-controlled, double-blind trial. Blood. 2007 Jan 1;109(1):139-44. doi: 10.1182/blood-2006-04-014654. Epub 2006 Sep 7. PMID: 29937169. --The number of recurrent events of venous thrombosis was 43 of 353 in the vitamin group (54/1000 py) and 50 of 348 in the placebo group (64/1000 py). The hazard ratio associated with vitamin treatment was 0.84 (95% CI, 0.56-1.26): 1.14 (95% CI, 0.65-1.98) in the hyperhomocysteinemic group and 0.58 (95% CI, 0.31-1.07) in the normohomocysteinemic group. The results of our study do not show that homocysteine lowering by B vitamin supplementation prevents recurrent venous thrombosis.

## 2019-07-29 ENCOUNTER — Inpatient Hospital Stay: Payer: Commercial Managed Care - PPO

## 2019-07-29 ENCOUNTER — Inpatient Hospital Stay: Payer: Commercial Managed Care - PPO | Attending: Hematology and Oncology | Admitting: Hematology and Oncology

## 2019-07-29 ENCOUNTER — Other Ambulatory Visit: Payer: Self-pay

## 2019-07-29 VITALS — BP 116/79 | HR 92 | Temp 98.0°F | Resp 18 | Ht 73.0 in | Wt 187.8 lb

## 2019-07-29 DIAGNOSIS — I2699 Other pulmonary embolism without acute cor pulmonale: Secondary | ICD-10-CM | POA: Diagnosis not present

## 2019-07-29 DIAGNOSIS — Z7901 Long term (current) use of anticoagulants: Secondary | ICD-10-CM | POA: Diagnosis not present

## 2019-07-29 DIAGNOSIS — R7989 Other specified abnormal findings of blood chemistry: Secondary | ICD-10-CM

## 2019-08-08 ENCOUNTER — Encounter: Payer: Self-pay | Admitting: Podiatry

## 2019-08-11 ENCOUNTER — Other Ambulatory Visit: Payer: Commercial Managed Care - PPO | Admitting: Orthotics

## 2019-08-18 ENCOUNTER — Other Ambulatory Visit: Payer: Self-pay

## 2019-08-18 ENCOUNTER — Ambulatory Visit: Payer: Commercial Managed Care - PPO | Admitting: Orthotics

## 2019-08-18 DIAGNOSIS — M722 Plantar fascial fibromatosis: Secondary | ICD-10-CM

## 2019-08-25 NOTE — Progress Notes (Signed)
Patient came in today to pick up custom made foot orthotics.  The goals were accomplished and the patient reported no dissatisfaction with said orthotics.  Patient was advised of breakin period and how to report any issues. 

## 2020-05-23 ENCOUNTER — Encounter: Payer: Self-pay | Admitting: Hematology and Oncology

## 2020-05-25 ENCOUNTER — Telehealth: Payer: Self-pay | Admitting: Hematology and Oncology

## 2020-05-25 ENCOUNTER — Other Ambulatory Visit: Payer: Self-pay | Admitting: Hematology and Oncology

## 2020-05-25 MED ORDER — RIVAROXABAN 20 MG PO TABS: 20.0000 mg | ORAL_TABLET | Freq: Every day | ORAL | 2 refills | Status: DC

## 2020-05-25 NOTE — Telephone Encounter (Signed)
Scheduled appt per 9/17 sch msg- pt aware of appt.

## 2020-06-14 ENCOUNTER — Encounter: Payer: Self-pay | Admitting: Hematology and Oncology

## 2020-06-14 ENCOUNTER — Inpatient Hospital Stay: Payer: Commercial Managed Care - PPO | Attending: Hematology and Oncology | Admitting: Hematology and Oncology

## 2020-06-14 DIAGNOSIS — I2699 Other pulmonary embolism without acute cor pulmonale: Secondary | ICD-10-CM | POA: Insufficient documentation

## 2020-06-14 DIAGNOSIS — R7989 Other specified abnormal findings of blood chemistry: Secondary | ICD-10-CM | POA: Diagnosis not present

## 2020-06-14 DIAGNOSIS — Z7901 Long term (current) use of anticoagulants: Secondary | ICD-10-CM | POA: Insufficient documentation

## 2020-06-14 MED ORDER — RIVAROXABAN 10 MG PO TABS: 10.0000 mg | ORAL_TABLET | Freq: Every day | ORAL | 3 refills | Status: DC

## 2020-06-14 NOTE — Progress Notes (Signed)
[facility] Telephone:(336) [phone]   Fax:(336) [phone]  TELEPHONE PROGRESS NOTE  Patient Care Team: Mayer Masker, Cordelia Poche as PCP - General  Hematological/Oncological History # Recurrent Pulmonary Embolism 1) 2017--diagnosed with pulmonary embolism following hand surgery 2) 07/16/2019: presented with sharp left scapular pain. CT PE study showed solitary segmental acute pulmonary embolus in the posterior basal segment left lower lobe, with possible small associated pulmonary infarct. No LE Dopplers performed. Started on Xarelto PO therapy.  3) 07/28/2019: establish care with Dr. Leonides Schanz. Continue Xarelto 20mg  PO daily 4) 06/14/2020: decrease to maintenance dose Xarelto 10mg  PO daily  Prior Workup Includes:  07/16/2019:  Negative Factor V Leiden Negative Prothrombin Gene Mutation Undetectable Beta-2-Glycoprotein/Anticardiolipin antibodies. Negative Lupus anticoagulant panel Protein C and S levels within normal limits Homocysteine 204.8 (nml 0-14.5)    Interval History:  Darrell Nguyen 39 y.o. male with medical history significant for recurrent unprovoked PEs who presents for a follow up visit. The patient's last visit was on 07/28/2020 at which time he established care. In the interim since the last visit he has been well with no ED visits or hospitalizations.  On discussion today Darrell Nguyen notes that he has been tolerating his Xarelto therapy well and has only missed "a handful of doses" in the last year.  He notes that he has had no signs of recurrent VTE including leg swelling, leg pain, chest pain, or shortness of breath.  He notes that he is also had no issues with bleeding, bruising, or dark stools.  He notes that he is currently attempting to get off of his prednisone therapy and has been weaning down.  His goal is to be off prednisone entirely in the near future.  Otherwise he has had no changes in his medications, changes in his health, or concerns.  He denies any  fevers, chills, sweats, nausea, vomiting or diarrhea.  A full 10 point ROS is listed below.  MEDICAL HISTORY:  Past Medical History:  Diagnosis Date  . Nasal fracture     SURGICAL HISTORY: Past Surgical History:  Procedure Laterality Date  . HAND SURGERY    . NASAL FRACTURE SURGERY      SOCIAL HISTORY: Social History   Socioeconomic History  . Marital status: Married    Spouse name: Not on file  . Number of children: Not on file  . Years of education: Not on file  . Highest education level: Not on file  Occupational History  . Not on file  Tobacco Use  . Smoking status: Never Smoker  . Smokeless tobacco: Never Used  Vaping Use  . Vaping Use: Never used  Substance and Sexual Activity  . Alcohol use: Yes    Alcohol/week: 6.0 standard drinks    Types: 6 Standard drinks or equivalent per week  . Drug use: Never  . Sexual activity: Yes    Birth control/protection: None  Other Topics Concern  . Not on file  Social History Narrative  . Not on file   Social Determinants of Health   Financial Resource Strain:   . Difficulty of Paying Living Expenses: Not on file  Food Insecurity:   . Worried About 07/30/2020 in the Last Year: Not on file  . Ran Out of Food in the Last Year: Not on file  Transportation Needs:   . Lack of Transportation (Medical): Not on file  . Lack of Transportation (Non-Medical): Not on file  Physical Activity:   . Days of Exercise per Week: Not  on file  . Minutes of Exercise per Session: Not on file  Stress:   . Feeling of Stress : Not on file  Social Connections:   . Frequency of Communication with Friends and Family: Not on file  . Frequency of Social Gatherings with Friends and Family: Not on file  . Attends Religious Services: Not on file  . Active Member of Clubs or Organizations: Not on file  . Attends Banker Meetings: Not on file  . Marital Status: Not on file  Intimate Partner Violence:   . Fear of Current or  Ex-Partner: Not on file  . Emotionally Abused: Not on file  . Physically Abused: Not on file  . Sexually Abused: Not on file    FAMILY HISTORY: Family History  Problem Relation Age of Onset  . Breast cancer Mother   . Liver cancer Maternal Grandfather     ALLERGIES:  is allergic to augmentin [amoxicillin-pot clavulanate].  MEDICATIONS:  Current Outpatient Medications  Medication Sig Dispense Refill  . budesonide (PULMICORT) 0.5 MG/2ML nebulizer solution 0.5 mg See admin instructions. Mix one vial (0.5 mg) in 8 oz (240 ml) saline solution in neo med bottle and use as a saline rinse (both nostrils) every morning    . Cholecalciferol (VITAMIN D) 50 MCG (2000 UT) tablet Take 2,000 Units by mouth every morning.    . dupilumab (DUPIXENT) 300 MG/2ML prefilled syringe Inject 300 mg into the skin every 14 (fourteen) days.     . montelukast (SINGULAIR) 10 MG tablet Take 10 mg by mouth every morning.     . predniSONE (DELTASONE) 10 MG tablet Take 10 mg by mouth daily with breakfast. Continuous course    . rivaroxaban (XARELTO) 10 MG TABS tablet Take 1 tablet (10 mg total) by mouth daily. 90 tablet 3   No current facility-administered medications for this visit.    REVIEW OF SYSTEMS:   Constitutional: ( - ) fevers, ( - )  chills , ( - ) night sweats Eyes: ( - ) blurriness of vision, ( - ) double vision, ( - ) watery eyes Ears, nose, mouth, throat, and face: ( - ) mucositis, ( - ) sore throat Respiratory: ( - ) cough, ( - ) dyspnea, ( - ) wheezes Cardiovascular: ( - ) palpitation, ( - ) chest discomfort, ( - ) lower extremity swelling Gastrointestinal:  ( - ) nausea, ( - ) heartburn, ( - ) change in bowel habits Skin: ( - ) abnormal skin rashes Lymphatics: ( - ) new lymphadenopathy, ( - ) easy bruising Neurological: ( - ) numbness, ( - ) tingling, ( - ) new weaknesses Behavioral/Psych: ( - ) mood change, ( - ) new changes  All other systems were reviewed with the patient and are  negative.  PHYSICAL EXAMINATION:  Deferred, Telephone visit  LABORATORY DATA:  I have reviewed the data as listed CBC Latest Ref Rng & Units 07/18/2019 07/17/2019 07/16/2019  WBC 4.0 - 10.5 K/uL 10.4 10.7(H) 8.9  Hemoglobin 13.0 - 17.0 g/dL 25.8 52.7 78.2  Hematocrit 39 - 52 % 48.9 47.5 47.6  Platelets 150 - 400 K/uL 251 245 220    CMP Latest Ref Rng & Units 07/16/2019 03/10/2018  Glucose 70 - 99 mg/dL 89 -  BUN 6 - 20 mg/dL 11 16  Creatinine 4.23 - 1.24 mg/dL 5.36 1.3  Sodium 144 - 145 mmol/L 140 146  Potassium 3.5 - 5.1 mmol/L 3.6 5.5(A)  Chloride 98 - 111 mmol/L 106 -  CO2 22 - 32 mmol/L 24 -  Calcium 8.9 - 10.3 mg/dL 9.0 -  Alkaline Phos 25 - 125 - 89  AST 14 - 40 - 19  ALT 10 - 40 - 34    RADIOGRAPHIC STUDIES: No results found.  ASSESSMENT & PLAN Darrell Nguyen 39 y.o. male with medical history significant for recurrent unprovoked PEs who presents for a follow up visit. After review the labs, and discussion with the patient his findings are most consistent with well-tolerated anticoagulation therapy in the setting of recurrent PEs. Today we discussed decreasing the dosage of his Xarelto down to 10 mg p.o. daily from the baseline of 20 mg p.o. daily. This is based on the [facility] Choice trial which showed good efficacy of 20 mg and 10 mg of Xarelto after 6 months of initial full dose therapy. After discussing the risks and benefits Mr. Usman noted that he would would like to transition to the 10 mg dosage. Otherwise he had no evidence of recurrent VTE today and we can continue Xarelto with a 8-month phone visit follow-up.  #Recurrent Pulmonary Embolism #Elevated Homocysteine --continue Xarelto therapy. Given this unprovoked 2nd pulmonary embolism, our recommendation will be for lifelong anticoagulation.  --hypercoagulation workup was negative, with the exception of elevated homocysteine. --unfortunately if hyperhomocysteinemia is the etiology, administration of B vitamins and  folate do not decrease the risk of recurrent CVA, MI or thrombus. --hold on sending an MTHFR panel to work up of hyperhomocysteinemia, because the results of this panel will not change management  --will decrease Xarelto dose to 10mg  PO daily as maintenance dosing. Discussed risks and benefits of decreased dose with the patient.  --RTC in 6 months to re-evaluate.   *Xarelto refill to be called into Drug in [place]   No orders of the defined types were placed in this encounter.   All questions were answered. The patient knows to call the clinic with any problems, questions or concerns.  A total of more than 20 minutes were spent on this encounter and over half of that time was spent on counseling and coordination of care as outlined above.   [place], MD Department of Hematology/Oncology [facility] at [facility] Phone: [phone] Pager: [phone] Email: [phone]  06/14/2020 4:40 PM  08/14/2020, Ross Marcus, Prins MH, B[place] R, Beyer-[place] J, B[place] H, [place], Cohen AT, [place] BL, [place] H, [place] MCS, [place], [place], [place], [place] B, Pap AF, [place], [place] P, [place] PS, Prandoni P; [employer] CHOICE Investigators. Rivaroxaban or Aspirin for Extended Treatment of Venous Thromboembolism. [employer] J Med. 2017 Mar 30;376(13):1211-1222.  --Among patients with venous thromboembolism in equipoise for continued anticoagulation, the risk of a recurrent event was significantly lower with rivaroxaban at either a treatment dose (20 mg) or a prophylactic dose (10 mg) than with aspirin, without a significant increase in bleeding rates.

## 2020-06-19 ENCOUNTER — Telehealth: Payer: Self-pay | Admitting: Hematology and Oncology

## 2020-06-19 NOTE — Telephone Encounter (Signed)
Scheduled per los. Called and left msg. Mailed printout with note to inform of f/u being a phone visit

## 2020-06-22 IMAGING — CT CT ANGIO CHEST
2 of 7 series · 18 of 46 positions shown · IV contrast (APPLIED)
Comparison: None.

CLINICAL DATA: Left mid back pain.  History of PE.

EXAM:
CT ANGIOGRAPHY CHEST WITH CONTRAST
TECHNIQUE: Multidetector CT imaging of the chest was performed using the
standard protocol during bolus administration of intravenous
contrast. Multiplanar CT image reconstructions and MIPs were
obtained to evaluate the vascular anatomy.
CONTRAST:  61mL OMNIPAQUE IOHEXOL 350 MG/ML SOLN

[Series 7: thins · axial · 0.77mm/px · z∈[+1097,+1365]mm · 15 of 432 slices shown]
[im 24/432  lung]
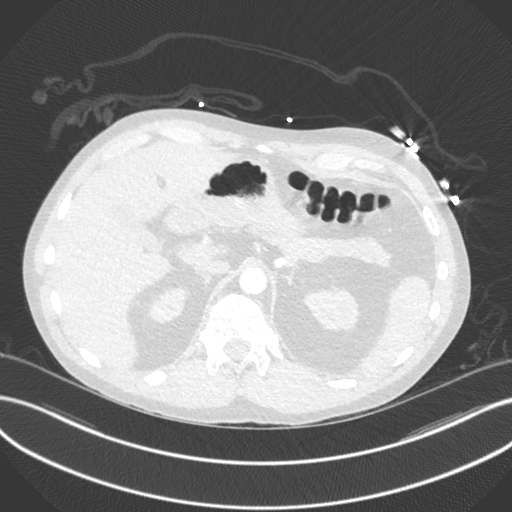
[im 48/432  soft-tissue]
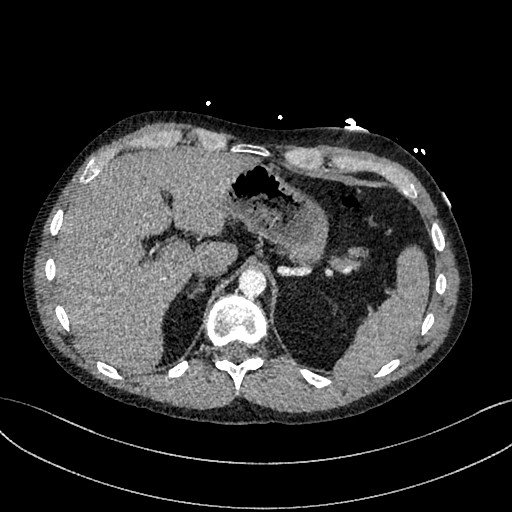
[im 72/432  lung]
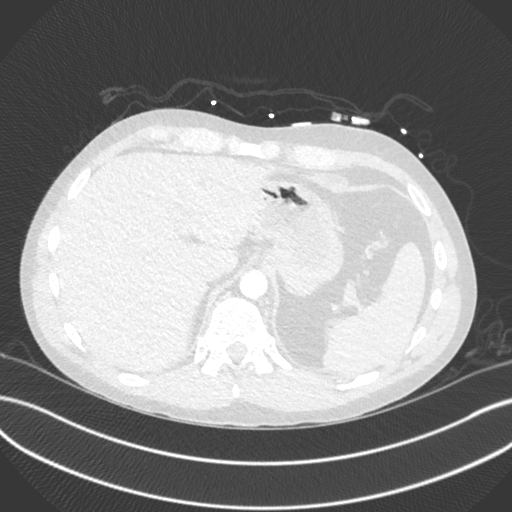
[im 96/432  soft-tissue]
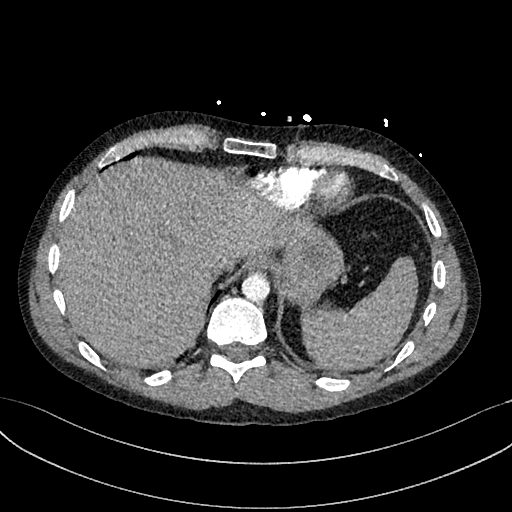
[im 144/432  lung]
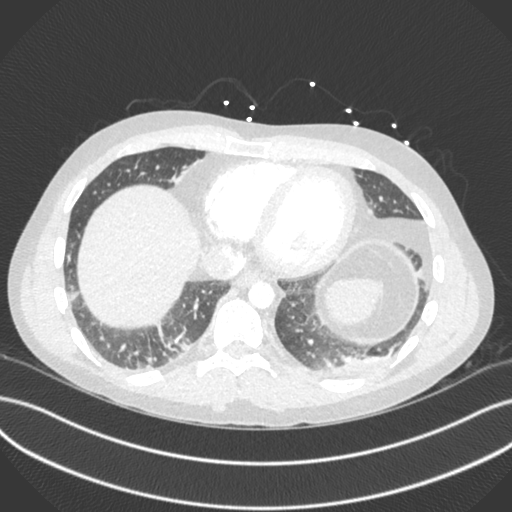
[im 168/432  soft-tissue]
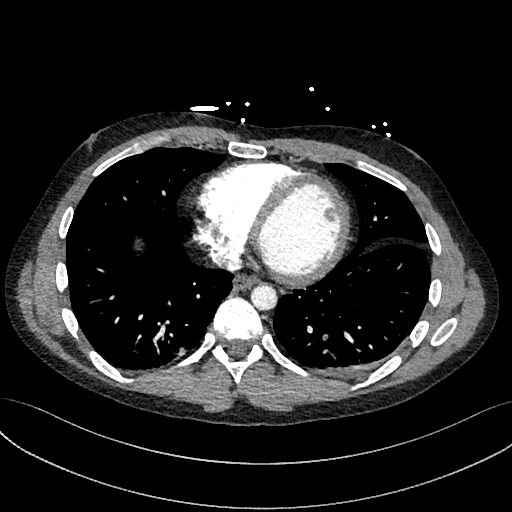
[im 192/432  lung]
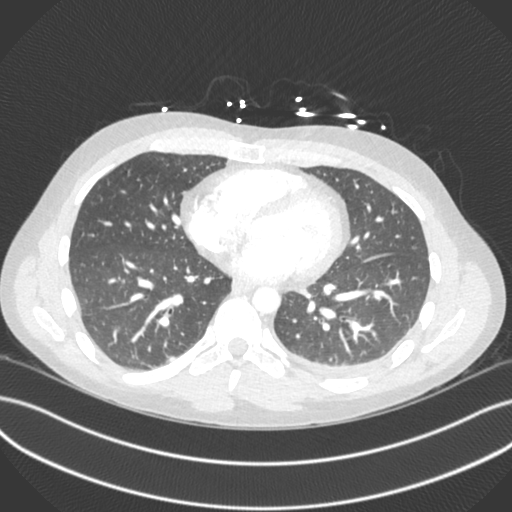
[im 216/432  soft-tissue]
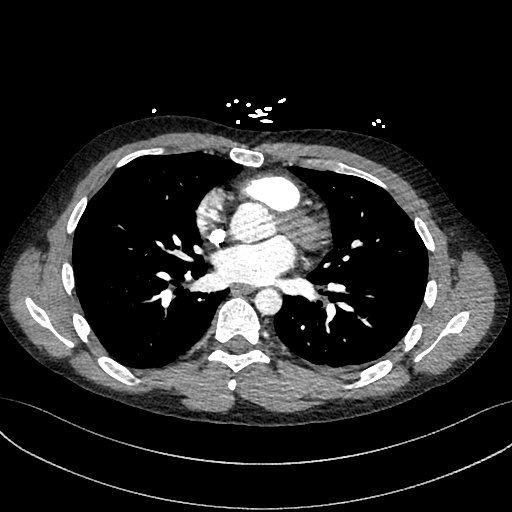
[im 240/432  lung]
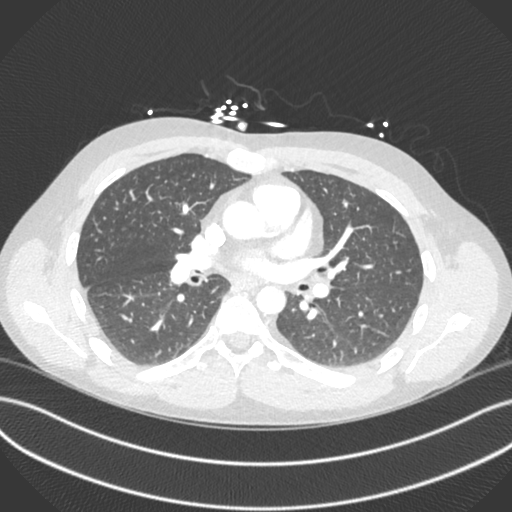
[im 264/432  soft-tissue]
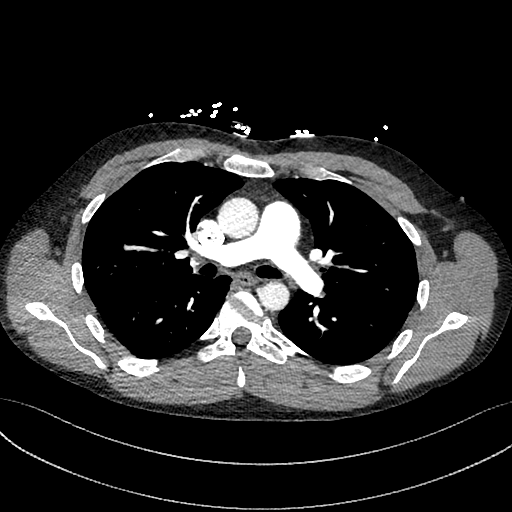
[im 288/432  lung]
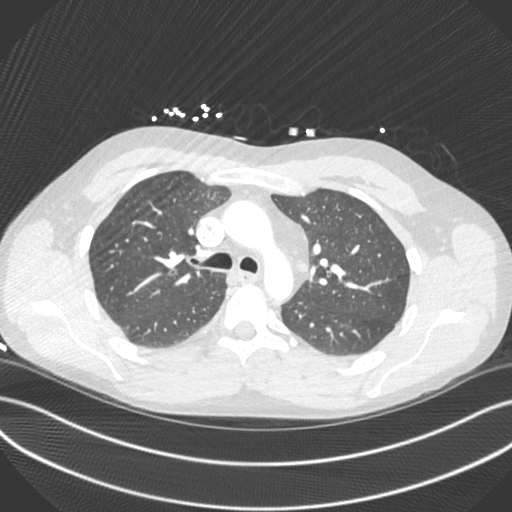
[im 336/432  soft-tissue]
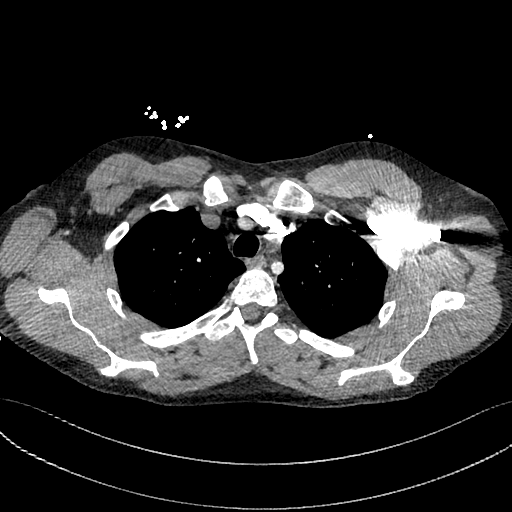
[im 360/432  lung]
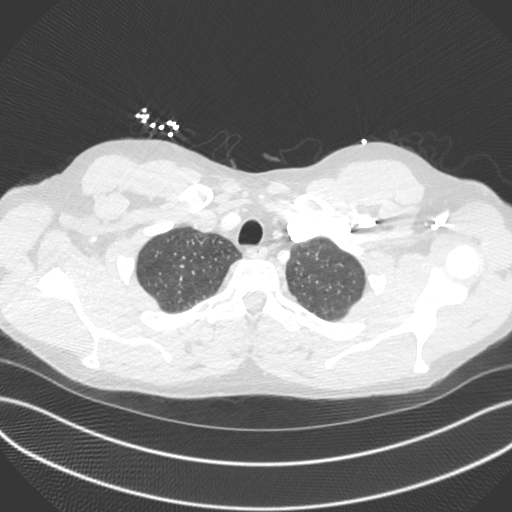
[im 384/432  soft-tissue]
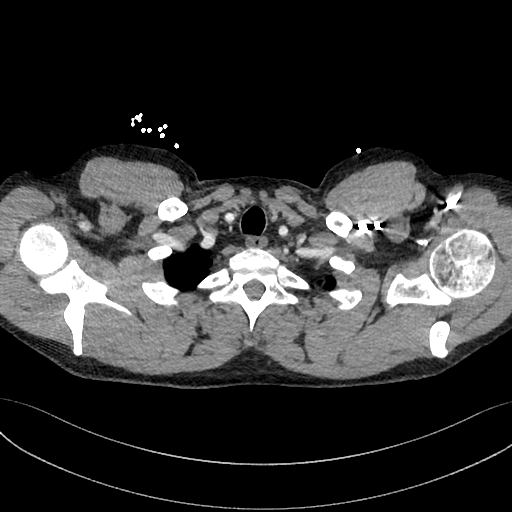
[im 408/432  lung]
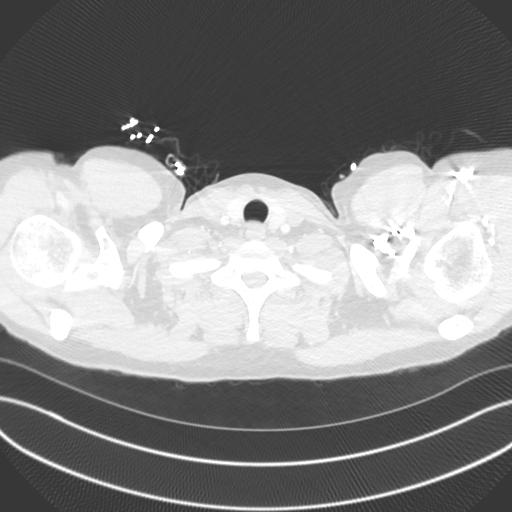

[Series 8: cor · coronal · 0.63mm/px · 3 of 123 slices shown]
[im 31/123  soft-tissue]
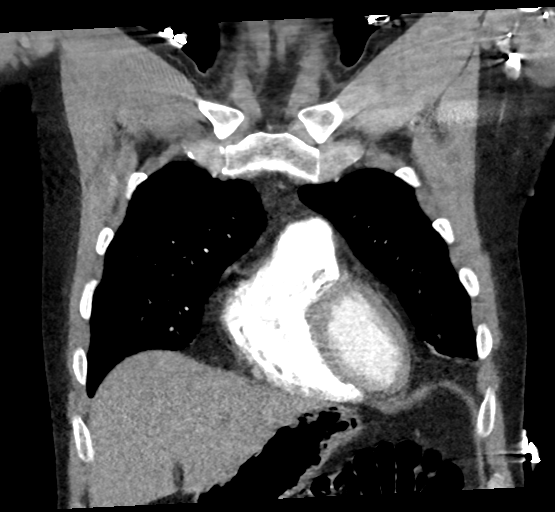
[im 62/123  soft-tissue]
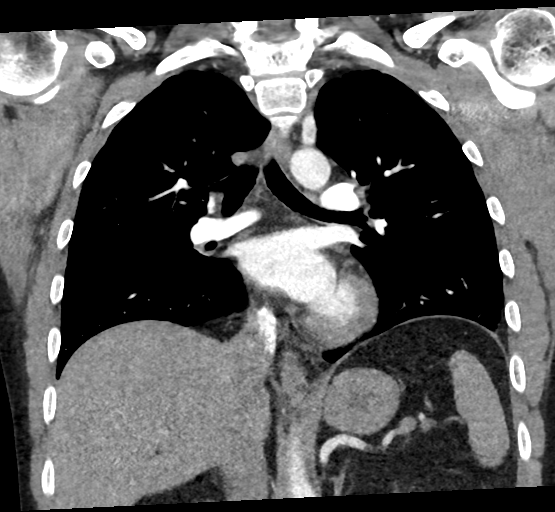
[im 92/123  soft-tissue]
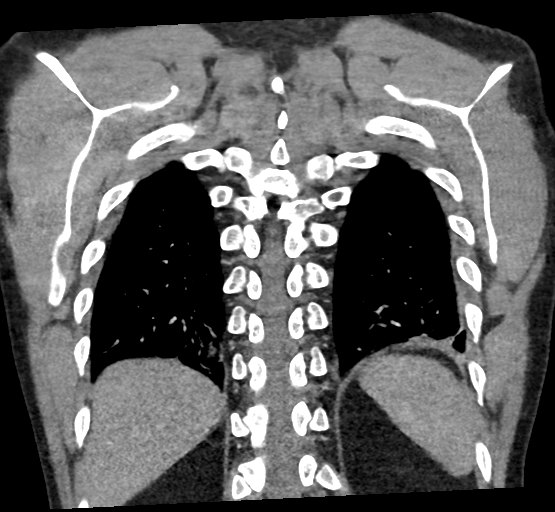

[18 of 46 positions shown; findings below may reference images not displayed]

FINDINGS: Cardiovascular: Heart size normal. No pericardial effusion.
Satisfactory opacification of pulmonary arteries noted, and there is
no evidence of pulmonary emboli. There is good contrast
opacification of the pulmonary arterial tree.

There is a single segmental filling defect in posterior basal
segment left lower lobe pulmonary artery consistent with acute PE.
No stigmata of chronic pulmonary emboli. Adequate contrast
opacification of the thoracic aorta with no evidence of dissection,
aneurysm, or stenosis. There is classic 3-vessel brachiocephalic
arch anatomy without proximal stenosis. No significant atheromatous
change.

Mediastinum/Nodes: No hilar or mediastinal adenopathy.

Lungs/Pleura: No pleural effusion. No pneumothorax. Pleural-based
wedge-like consolidation in the posterolateral left lower lobe
possibly small pulmonary infarct. Subsegmental
consolidation/atelectasis posteriorly in both lower lobes left
greater than right. Lungs otherwise clear.

Upper Abdomen: No acute findings

Musculoskeletal: No chest wall abnormality. No acute or significant
osseous findings.

Review of the MIP images confirms the above findings.
IMPRESSION: 1. POSITIVE for solitary segmental acute pulmonary embolus in the
posterior basal segment left lower lobe, with possible small
associated pulmonary infarct.

## 2020-06-22 IMAGING — CR DG CHEST 2V
2 series · 2 of 2 positions shown · non-contrast
Comparison: None.

CLINICAL DATA: 38-year-old male with a history of back pain

EXAM:
CHEST - 2 VIEW

[chest pa]
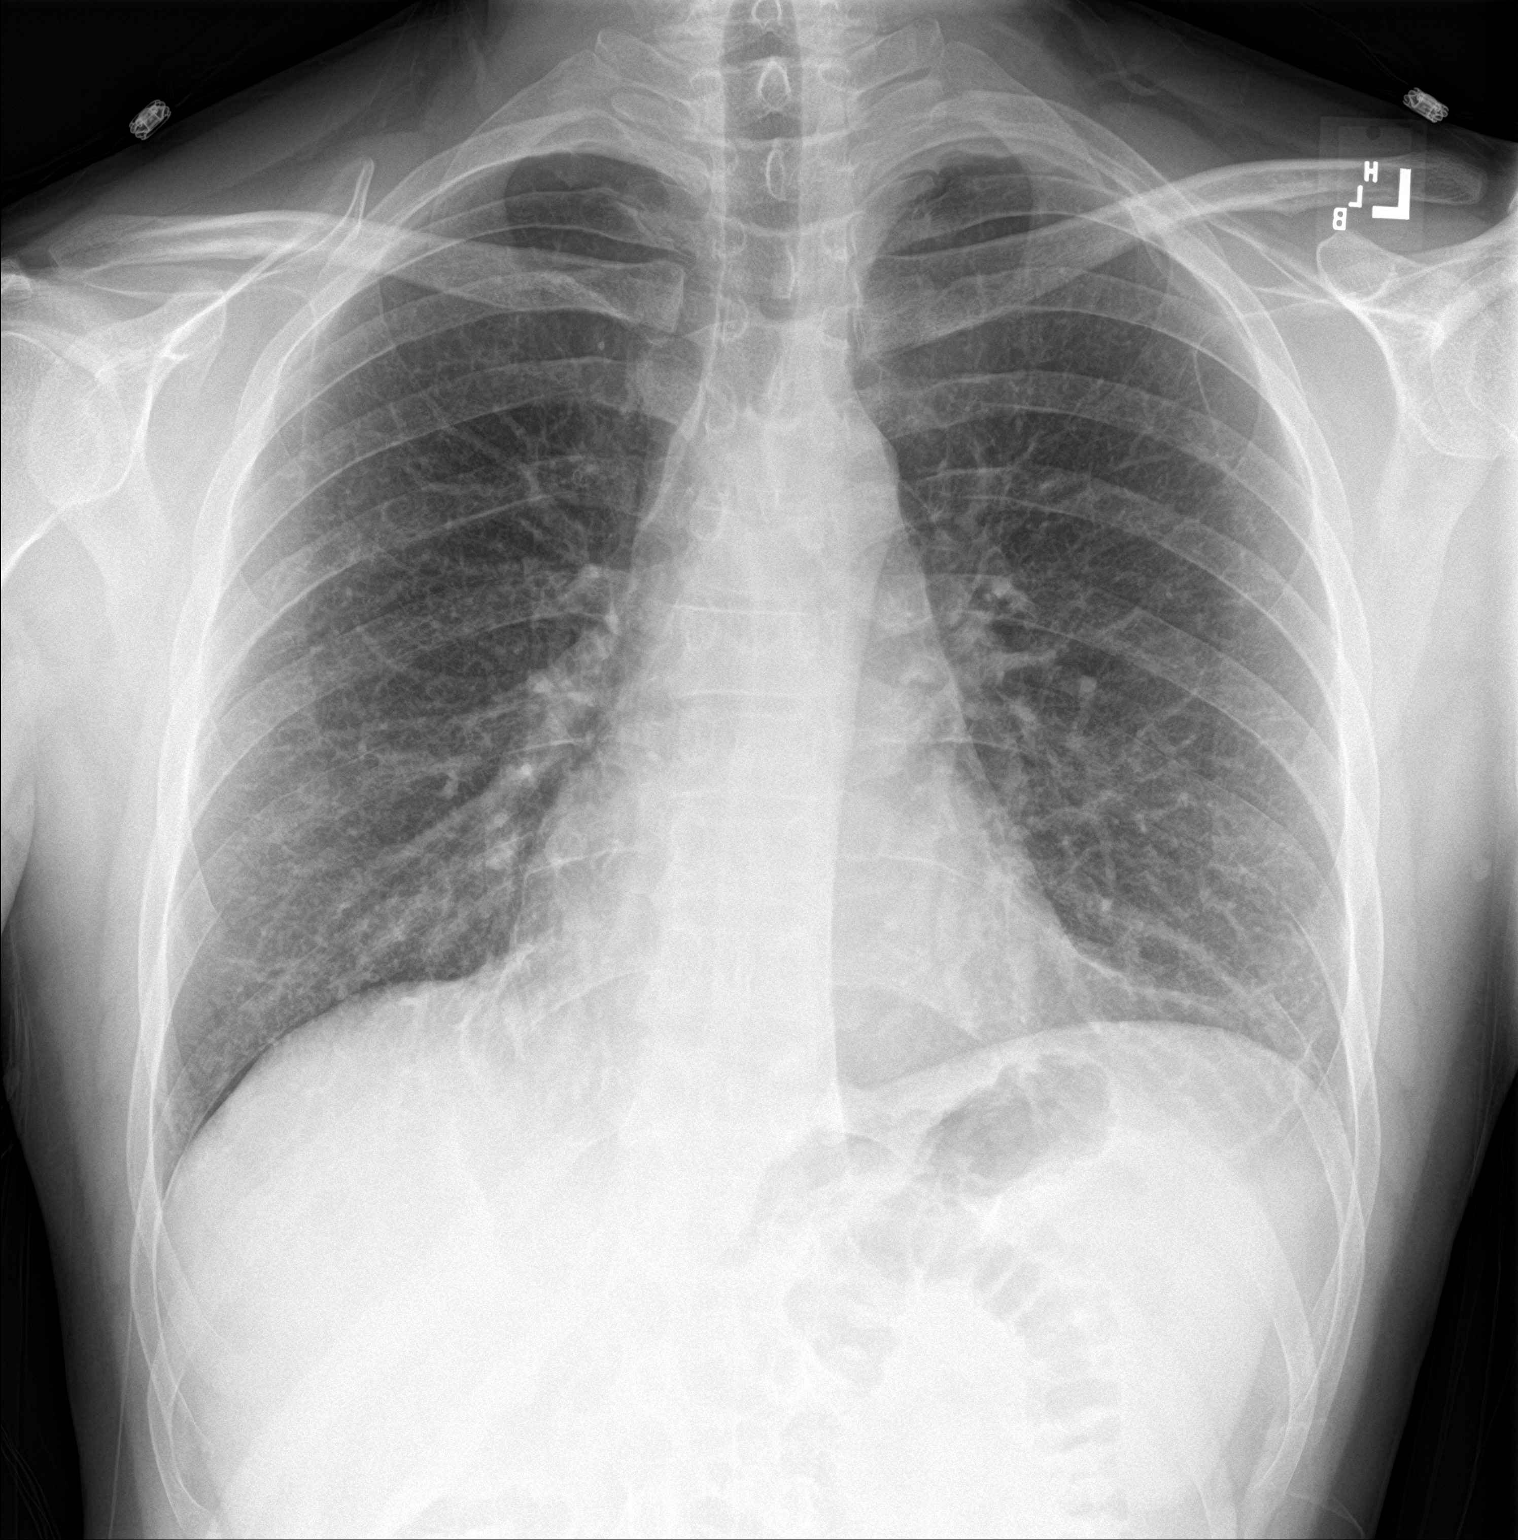

[chest lat]
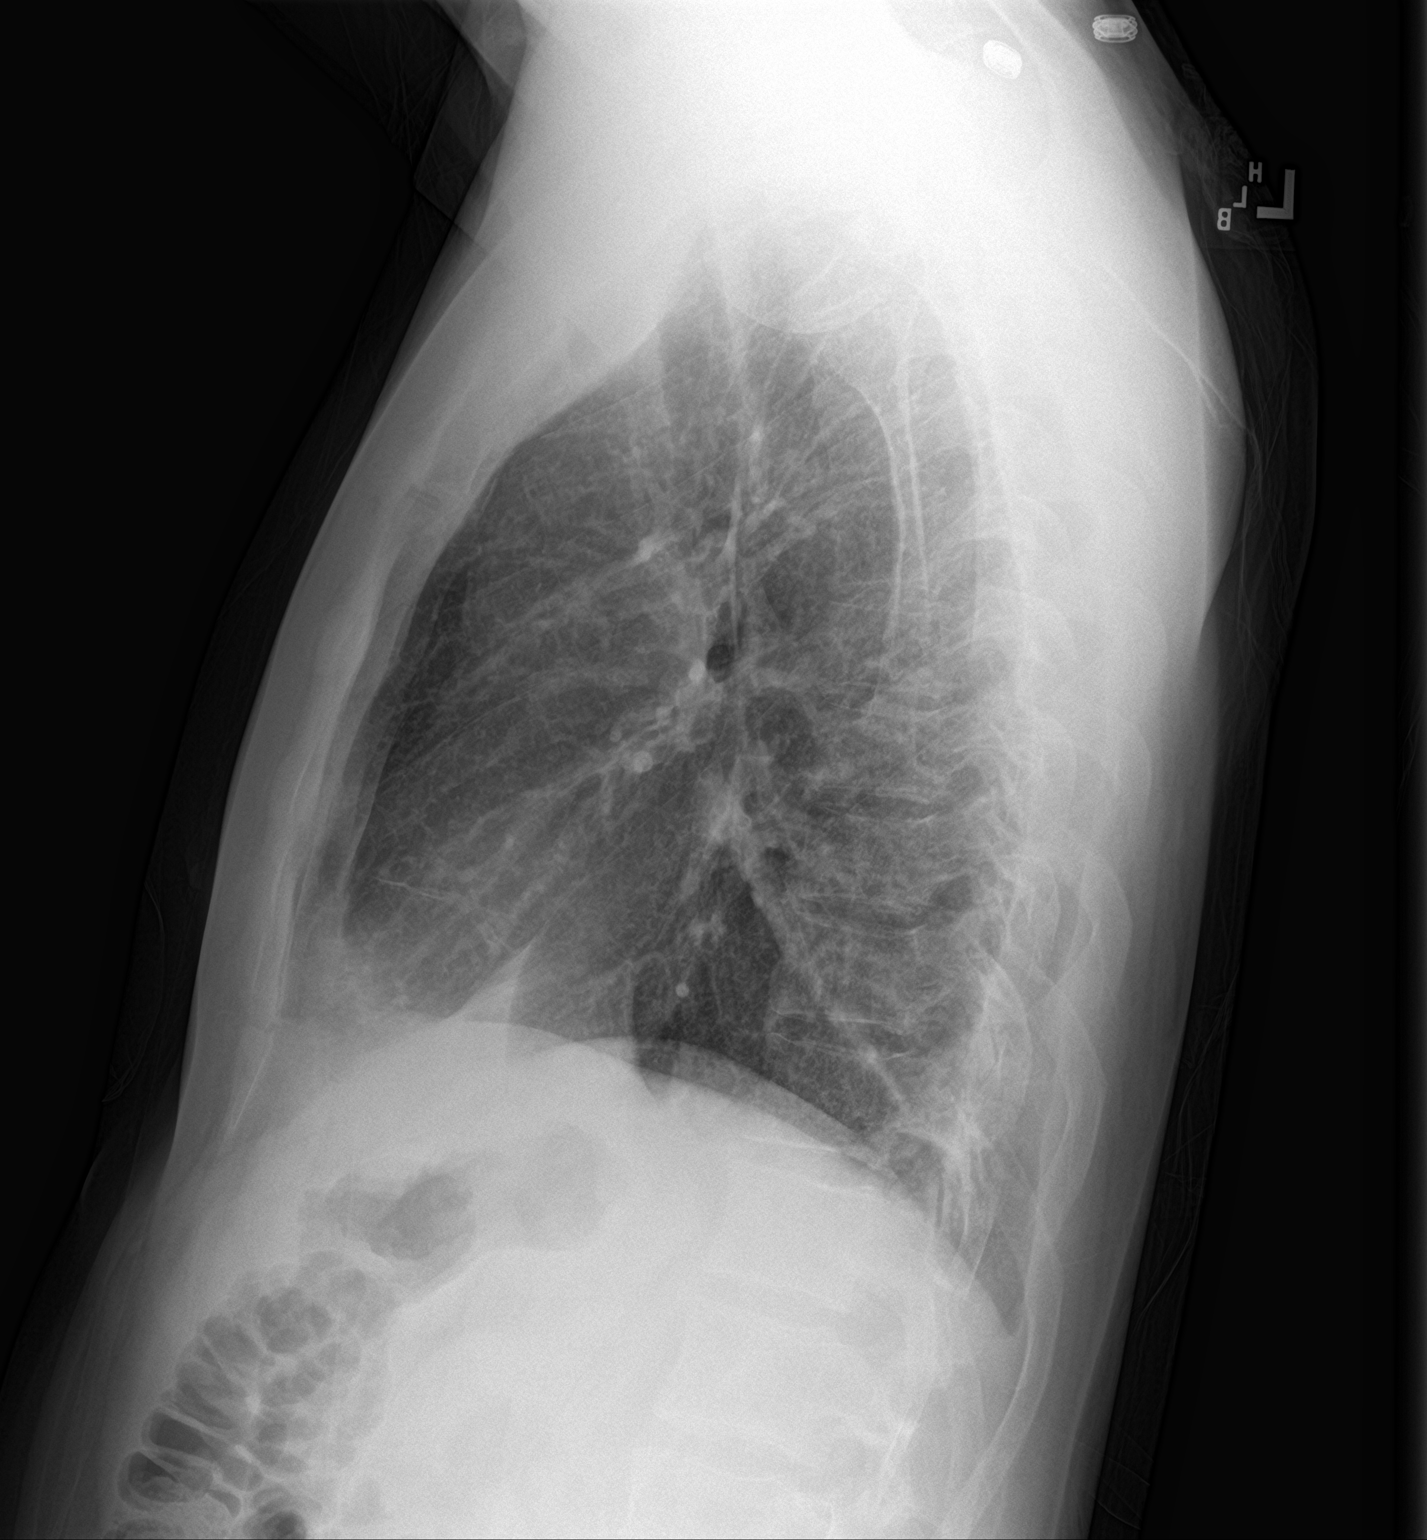

[2 of 2 positions shown; findings below may reference images not displayed]

FINDINGS: Cardiomediastinal silhouette within normal limits in size and
contour. No evidence of central vascular congestion. No pneumothorax
or pleural effusion. No confluent airspace disease.

No displaced fracture.
IMPRESSION: Negative for acute cardiopulmonary disease

## 2020-12-10 ENCOUNTER — Telehealth: Payer: Self-pay | Admitting: Hematology and Oncology

## 2020-12-10 NOTE — Telephone Encounter (Signed)
Scheduled per 4/4 sch msg. Called and spoke with pt, confirmed 6/1 and 6/8 appts

## 2020-12-12 ENCOUNTER — Inpatient Hospital Stay: Payer: Commercial Managed Care - PPO

## 2020-12-19 ENCOUNTER — Inpatient Hospital Stay: Payer: Commercial Managed Care - PPO | Admitting: Hematology and Oncology

## 2021-02-05 ENCOUNTER — Other Ambulatory Visit: Payer: Self-pay | Admitting: Hematology and Oncology

## 2021-02-05 DIAGNOSIS — I2699 Other pulmonary embolism without acute cor pulmonale: Secondary | ICD-10-CM

## 2021-02-06 ENCOUNTER — Other Ambulatory Visit: Payer: Self-pay

## 2021-02-06 ENCOUNTER — Inpatient Hospital Stay: Payer: Commercial Managed Care - PPO | Attending: Hematology and Oncology

## 2021-02-06 DIAGNOSIS — I2699 Other pulmonary embolism without acute cor pulmonale: Secondary | ICD-10-CM | POA: Insufficient documentation

## 2021-02-06 LAB — CBC WITH DIFFERENTIAL (CANCER CENTER ONLY)
Abs Immature Granulocytes: 0.01 10*3/uL (ref 0.00–0.07)
Basophils Absolute: 0 10*3/uL (ref 0.0–0.1)
Basophils Relative: 1 %
Eosinophils Absolute: 0.6 10*3/uL — ABNORMAL HIGH (ref 0.0–0.5)
Eosinophils Relative: 12 %
HCT: 44.1 % (ref 39.0–52.0)
Hemoglobin: 15.4 g/dL (ref 13.0–17.0)
Immature Granulocytes: 0 %
Lymphocytes Relative: 32 %
Lymphs Abs: 1.5 10*3/uL (ref 0.7–4.0)
MCH: 30.9 pg (ref 26.0–34.0)
MCHC: 34.9 g/dL (ref 30.0–36.0)
MCV: 88.6 fL (ref 80.0–100.0)
Monocytes Absolute: 0.4 10*3/uL (ref 0.1–1.0)
Monocytes Relative: 9 %
Neutro Abs: 2.3 10*3/uL (ref 1.7–7.7)
Neutrophils Relative %: 46 %
Platelet Count: 202 10*3/uL (ref 150–400)
RBC: 4.98 MIL/uL (ref 4.22–5.81)
RDW: 12.9 % (ref 11.5–15.5)
WBC Count: 4.8 10*3/uL (ref 4.0–10.5)
nRBC: 0 % (ref 0.0–0.2)

## 2021-02-06 LAB — CMP (CANCER CENTER ONLY)
ALT: 31 U/L (ref 0–44)
AST: 23 U/L (ref 15–41)
Albumin: 4 g/dL (ref 3.5–5.0)
Alkaline Phosphatase: 98 U/L (ref 38–126)
Anion gap: 12 (ref 5–15)
BUN: 15 mg/dL (ref 6–20)
CO2: 24 mmol/L (ref 22–32)
Calcium: 9 mg/dL (ref 8.9–10.3)
Chloride: 107 mmol/L (ref 98–111)
Creatinine: 1.06 mg/dL (ref 0.61–1.24)
GFR, Estimated: >60 mL/min (ref >60)
Glucose, Bld: 87 mg/dL (ref 70–99)
Potassium: 4.1 mmol/L (ref 3.5–5.1)
Sodium: 143 mmol/L (ref 135–145)
Total Bilirubin: 1 mg/dL (ref 0.3–1.2)
Total Protein: 6.5 g/dL (ref 6.5–8.1)

## 2021-02-13 ENCOUNTER — Inpatient Hospital Stay (HOSPITAL_BASED_OUTPATIENT_CLINIC_OR_DEPARTMENT_OTHER): Payer: Commercial Managed Care - PPO | Admitting: Hematology and Oncology

## 2021-02-13 ENCOUNTER — Encounter: Payer: Self-pay | Admitting: Hematology and Oncology

## 2021-02-13 ENCOUNTER — Other Ambulatory Visit: Payer: Self-pay

## 2021-02-13 DIAGNOSIS — R7989 Other specified abnormal findings of blood chemistry: Secondary | ICD-10-CM

## 2021-02-13 DIAGNOSIS — I2699 Other pulmonary embolism without acute cor pulmonale: Secondary | ICD-10-CM

## 2021-02-13 MED ORDER — RIVAROXABAN 10 MG PO TABS: 10.0000 mg | ORAL_TABLET | Freq: Every day | ORAL | 2 refills | Status: DC

## 2021-02-13 NOTE — Progress Notes (Signed)
[facility] [phone]   [phone]  TELEPHONE PROGRESS NOTE  Patient Care Team: Mayer Masker, Cordelia Poche as PCP - General  Hematological/Oncological History # Recurrent Pulmonary Embolism 1) 2017--diagnosed with pulmonary embolism following hand surgery 2) 07/16/2019: presented with sharp left scapular pain. CT PE study showed solitary segmental acute pulmonary embolus in the posterior basal segment left lower lobe, with possible small associated pulmonary infarct. No LE Dopplers performed. Started on Xarelto PO therapy.  3) 07/28/2019: establish care with Dr. Leonides Schanz. Continue Xarelto 20mg  PO daily 4) 06/14/2020: decrease to maintenance dose Xarelto 10mg  PO daily  Prior Workup Includes:  07/16/2019:  Negative Factor V Leiden Negative Prothrombin Gene Mutation Undetectable Beta-2-Glycoprotein/Anticardiolipin antibodies. Negative Lupus anticoagulant panel Protein C and S levels within normal limits Homocysteine 204.8 (nml 0-14.5)    Interval History:  Ghassan Coggeshall 40 y.o. male with medical history significant for recurrent unprovoked PEs who presents for a follow up phone visit. The patient's last visit was on 06/14/2020. In the interim since the last visit he has been well with no ED visits or hospitalizations.  On discussion today Mr. Chuba notes that he has been tolerating his Xarelto therapy well.  He does notice that he has some increased bleeding when he has a needlestick and is more prone to bruising.  He denies any "excess bleeding".  He also denies any dark stools.  The 10 mg p.o. Xarelto is only about $10 a month and is not been having any financial issues acquiring this medication.  He denies having any issues with leg swelling, chest pain, or shortness of breath.  Also of note he is off steroid therapy and is very pleased by this.  He denies any fevers, chills, sweats, nausea, vomiting or diarrhea.  A full 10 point ROS is listed  below.  MEDICAL HISTORY:  Past Medical History:  Diagnosis Date  . Nasal fracture     SURGICAL HISTORY: Past Surgical History:  Procedure Laterality Date  . HAND SURGERY    . NASAL FRACTURE SURGERY      SOCIAL HISTORY: Social History   Socioeconomic History  . Marital status: Married    Spouse name: Not on file  . Number of children: Not on file  . Years of education: Not on file  . Highest education level: Not on file  Occupational History  . Not on file  Tobacco Use  . Smoking status: Never Smoker  . Smokeless tobacco: Never Used  Vaping Use  . Vaping Use: Never used  Substance and Sexual Activity  . Alcohol use: Yes    Alcohol/week: 6.0 standard drinks    Types: 6 Standard drinks or equivalent per week  . Drug use: Never  . Sexual activity: Yes    Birth control/protection: None  Other Topics Concern  . Not on file  Social History Narrative  . Not on file   Social Determinants of Health   Financial Resource Strain: Not on file  Food Insecurity: Not on file  Transportation Needs: Not on file  Physical Activity: Not on file  Stress: Not on file  Social Connections: Not on file  Intimate Partner Violence: Not on file    FAMILY HISTORY: Family History  Problem Relation Age of Onset  . Breast cancer Mother   . Liver cancer Maternal Grandfather     ALLERGIES:  is allergic to augmentin [amoxicillin-pot clavulanate].  MEDICATIONS:  Current Outpatient Medications  Medication Sig Dispense Refill  . budesonide (PULMICORT) 0.5 MG/2ML nebulizer solution  0.5 mg See admin instructions. Mix one vial (0.5 mg) in 8 oz (240 ml) saline solution in neo med bottle and use as a saline rinse (both nostrils) every morning    . Cholecalciferol (VITAMIN D) 50 MCG (2000 UT) tablet Take 2,000 Units by mouth every morning.    . dupilumab (DUPIXENT) 300 MG/2ML prefilled syringe Inject 300 mg into the skin every 14 (fourteen) days.     . montelukast (SINGULAIR) 10 MG tablet  Take 10 mg by mouth every morning.     . predniSONE (DELTASONE) 10 MG tablet Take 10 mg by mouth daily with breakfast. Continuous course    . rivaroxaban (XARELTO) 10 MG TABS tablet Take 1 tablet (10 mg total) by mouth daily. 90 tablet 3   No current facility-administered medications for this visit.    REVIEW OF SYSTEMS:   Constitutional: ( - ) fevers, ( - )  chills , ( - ) night sweats Eyes: ( - ) blurriness of vision, ( - ) double vision, ( - ) watery eyes Ears, nose, mouth, throat, and face: ( - ) mucositis, ( - ) sore throat Respiratory: ( - ) cough, ( - ) dyspnea, ( - ) wheezes Cardiovascular: ( - ) palpitation, ( - ) chest discomfort, ( - ) lower extremity swelling Gastrointestinal:  ( - ) nausea, ( - ) heartburn, ( - ) change in bowel habits Skin: ( - ) abnormal skin rashes Lymphatics: ( - ) new lymphadenopathy, ( - ) easy bruising Neurological: ( - ) numbness, ( - ) tingling, ( - ) new weaknesses Behavioral/Psych: ( - ) mood change, ( - ) new changes  All other systems were reviewed with the patient and are negative.  PHYSICAL EXAMINATION:  Deferred, Telephone visit  LABORATORY DATA:  I have reviewed the data as listed CBC Latest Ref Rng & Units 02/06/2021 07/18/2019 07/17/2019  WBC 4.0 - 10.5 K/uL 4.8 10.4 10.7(H)  Hemoglobin 13.0 - 17.0 g/dL 65.7 84.6 96.2  Hematocrit 39.0 - 52.0 % 44.1 48.9 47.5  Platelets 150 - 400 K/uL 202 251 245    CMP Latest Ref Rng & Units 02/06/2021 07/16/2019 03/10/2018  Glucose 70 - 99 mg/dL 87 89 -  BUN 6 - 20 mg/dL 15 11 16   Creatinine 0.61 - 1.24 mg/dL 9.52 1.3  Sodium 8.41 - 145 mmol/L 143 140 146  Potassium 3.5 - 5.1 mmol/L 4.1 3.6 5.5(A)  Chloride 98 - 111 mmol/L 107 106 -  CO2 22 - 32 mmol/L 24 24 -  Calcium 8.9 - 10.3 mg/dL 9.0 9.0 -  Total Protein 6.5 - 8.1 g/dL 6.5 - -  Total Bilirubin 0.3 - 1.2 mg/dL 1.0 - -  Alkaline Phos 38 - 126 U/L 98 - 89  AST 15 - 41 U/L 23 - 19  ALT 0 - 44 U/L 31 - 34    RADIOGRAPHIC STUDIES: No  results found.  ASSESSMENT & PLAN Owen Pratte 40 y.o. male with medical history significant for recurrent unprovoked PEs who presents for a follow up visit.   After review of the labs and discussion with the findings are most consistent with well-tolerated anticoagulation therapy in the setting of recurrent PEs. Previously we discussed decreasing the dosage of his Xarelto down to 10 mg p.o. daily from the baseline of 20 mg p.o. daily. This is based on the [facility] Choice trial which showed good efficacy of 20 mg and 10 mg of Xarelto after 6 months of initial full dose therapy.  After discussing the risks and benefits Mr. Tith noted that he would would like to transition to the 10 mg dosage. Otherwise he had no evidence of recurrent VTE today and we can continue Xarelto with a 43-month phone visit follow-up.  #Recurrent Pulmonary Embolism #Elevated Homocysteine --continue Xarelto 10mg  PO daily therapy. Given this unprovoked 2nd pulmonary embolism, our recommendation will be for lifelong anticoagulation.  --hypercoagulation workup was negative, with the exception of elevated homocysteine. --unfortunately if hyperhomocysteinemia is the etiology, administration of B vitamins and folate do not decrease the risk of recurrent CVA, MI or thrombus. --hold on sending an MTHFR panel to work up of hyperhomocysteinemia, because the results of this panel will not change management  --continue maintenance dosing. Discussed risks and benefits of decreased dose with the patient.  --RTC in 6 months to re-evaluate.   *Xarelto refill to be called into Drug in [place]   No orders of the defined types were placed in this encounter.   All questions were answered. The patient knows to call the clinic with any problems, questions or concerns.  A total of more than 20 minutes were spent on this encounter and over half of that time was spent on counseling and coordination of care as outlined above.    [place], MD Department of Hematology/Oncology [facility] Phone: [phone] Pager: [phone] Email: [phone]  02/13/2021 8:12 AM  04/15/2021, Ross Marcus, Prins MH, Bau[place] R, Beyer-Westendorf J, B[place]aux H, [place], Cohen AT, [place] BL, [place] H, [place] MCS, [place], [place], [place], [place] B, Pap AF, [place], Ver[place] P, Wells PS, Prandoni P; [employer] CHOICE Investigators. Rivaroxaban or Aspirin for Extended Treatment of Venous Thromboembolism. [employer] J Med. 2017 Mar 30;376(13):1211-1222.  --Among patients with venous thromboembolism in equipoise for continued anticoagulation, the risk of a recurrent event was significantly lower with rivaroxaban at either a treatment dose (20 mg) or a prophylactic dose (10 mg) than with aspirin, without a significant increase in bleeding rates.

## 2021-02-15 ENCOUNTER — Telehealth: Payer: Self-pay | Admitting: Hematology and Oncology

## 2021-02-15 NOTE — Telephone Encounter (Signed)
Scheduled per los. Called and left msg. Mailed printout  °

## 2021-08-15 ENCOUNTER — Inpatient Hospital Stay: Payer: Commercial Managed Care - PPO

## 2021-08-15 ENCOUNTER — Inpatient Hospital Stay: Payer: Commercial Managed Care - PPO | Attending: Hematology and Oncology | Admitting: Hematology and Oncology

## 2021-08-15 ENCOUNTER — Other Ambulatory Visit: Payer: Self-pay

## 2021-08-15 ENCOUNTER — Other Ambulatory Visit: Payer: Self-pay | Admitting: Hematology and Oncology

## 2021-08-15 ENCOUNTER — Encounter: Payer: Self-pay | Admitting: Hematology and Oncology

## 2021-08-15 VITALS — BP 117/69 | HR 56 | Temp 97.6°F | Resp 17 | Wt 184.4 lb

## 2021-08-15 DIAGNOSIS — R7989 Other specified abnormal findings of blood chemistry: Secondary | ICD-10-CM

## 2021-08-15 DIAGNOSIS — I2693 Single subsegmental pulmonary embolism without acute cor pulmonale: Secondary | ICD-10-CM | POA: Insufficient documentation

## 2021-08-15 DIAGNOSIS — I2699 Other pulmonary embolism without acute cor pulmonale: Secondary | ICD-10-CM

## 2021-08-15 DIAGNOSIS — Z7901 Long term (current) use of anticoagulants: Secondary | ICD-10-CM | POA: Insufficient documentation

## 2021-08-15 LAB — CBC WITH DIFFERENTIAL (CANCER CENTER ONLY)
Abs Immature Granulocytes: 0.01 10*3/uL (ref 0.00–0.07)
Basophils Absolute: 0.1 10*3/uL (ref 0.0–0.1)
Basophils Relative: 1 %
Eosinophils Absolute: 0.8 10*3/uL — ABNORMAL HIGH (ref 0.0–0.5)
Eosinophils Relative: 12 %
HCT: 44.8 % (ref 39.0–52.0)
Hemoglobin: 14.9 g/dL (ref 13.0–17.0)
Immature Granulocytes: 0 %
Lymphocytes Relative: 26 %
Lymphs Abs: 1.7 10*3/uL (ref 0.7–4.0)
MCH: 30.6 pg (ref 26.0–34.0)
MCHC: 33.3 g/dL (ref 30.0–36.0)
MCV: 92 fL (ref 80.0–100.0)
Monocytes Absolute: 0.5 10*3/uL (ref 0.1–1.0)
Monocytes Relative: 8 %
Neutro Abs: 3.5 10*3/uL (ref 1.7–7.7)
Neutrophils Relative %: 53 %
Platelet Count: 195 10*3/uL (ref 150–400)
RBC: 4.87 MIL/uL (ref 4.22–5.81)
RDW: 12.8 % (ref 11.5–15.5)
WBC Count: 6.5 10*3/uL (ref 4.0–10.5)
nRBC: 0 % (ref 0.0–0.2)

## 2021-08-15 LAB — CMP (CANCER CENTER ONLY)
ALT: 45 U/L — ABNORMAL HIGH (ref 0–44)
AST: 26 U/L (ref 15–41)
Albumin: 4.2 g/dL (ref 3.5–5.0)
Alkaline Phosphatase: 111 U/L (ref 38–126)
Anion gap: 10 (ref 5–15)
BUN: 17 mg/dL (ref 6–20)
CO2: 26 mmol/L (ref 22–32)
Calcium: 8.7 mg/dL — ABNORMAL LOW (ref 8.9–10.3)
Chloride: 106 mmol/L (ref 98–111)
Creatinine: 1.13 mg/dL (ref 0.61–1.24)
GFR, Estimated: >60 mL/min
Glucose, Bld: 74 mg/dL (ref 70–99)
Potassium: 4.3 mmol/L (ref 3.5–5.1)
Sodium: 142 mmol/L (ref 135–145)
Total Bilirubin: 0.6 mg/dL (ref 0.3–1.2)
Total Protein: 6.6 g/dL (ref 6.5–8.1)

## 2021-08-15 NOTE — Progress Notes (Signed)
[place] [phone]   [phone]  TELEPHONE PROGRESS NOTE  Patient Care Team: Lorrene Reid, Hershal Coria as PCP - General  Hematological/Oncological History # Recurrent Pulmonary Embolism 1) 2017--diagnosed with pulmonary embolism following hand surgery 2) 07/16/2019: presented with sharp left scapular pain. CT PE study showed solitary segmental acute pulmonary embolus in the posterior basal segment left lower lobe, with possible small associated pulmonary infarct. No LE Dopplers performed. Started on Xarelto PO therapy.  3) 07/28/2019: establish care with Dr. Lorenso Courier. Continue Xarelto 20mg  PO daily 4) 06/14/2020: decrease to maintenance dose Xarelto 10mg  PO daily   Prior Workup Includes:   07/16/2019:  Negative Factor V Leiden Negative Prothrombin Gene Mutation Undetectable Beta-2-Glycoprotein/Anticardiolipin antibodies. Negative Lupus anticoagulant panel Protein C and S levels within normal limits Homocysteine 204.8 (nml 0-14.5)   Interval History:  Darrell Nguyen 40 y.o. male with medical history significant for recurrent unprovoked PEs who presents for a follow up visit. The patient's last visit was on 02/13/2021. In the interim since the last visit he has been well with no ED visits or hospitalizations.  On discussion today Darrell Nguyen notes he has been well in the interim since her last visit.  He is on no issues with bleeding or bruising though he does have occasional bruising if he bumps his arm.  He notes that the cost for the Xarelto is "not bad" at $10 per month.  He notes he is not having any issues with shortness of breath, chest pain, or leg swelling.  He is otherwise been at his baseline level of health with no other changes in his medications.  He denies any fevers, chills, sweats, nausea, vomiting or diarrhea.  A full 10 point ROS is listed below.  MEDICAL HISTORY:  Past Medical History:  Diagnosis Date   Nasal fracture     SURGICAL  HISTORY: Past Surgical History:  Procedure Laterality Date   HAND SURGERY     NASAL FRACTURE SURGERY      SOCIAL HISTORY: Social History   Socioeconomic History   Marital status: Married    Spouse name: Not on file   Number of children: Not on file   Years of education: Not on file   Highest education level: Not on file  Occupational History   Not on file  Tobacco Use   Smoking status: Never   Smokeless tobacco: Never  Vaping Use   Vaping Use: Never used  Substance and Sexual Activity   Alcohol use: Yes    Alcohol/week: 6.0 standard drinks    Types: 6 Standard drinks or equivalent per week   Drug use: Never   Sexual activity: Yes    Birth control/protection: None  Other Topics Concern   Not on file  Social History Narrative   Not on file   Social Determinants of Health   Financial Resource Strain: Not on file  Food Insecurity: Not on file  Transportation Needs: Not on file  Physical Activity: Not on file  Stress: Not on file  Social Connections: Not on file  Intimate Partner Violence: Not on file    FAMILY HISTORY: Family History  Problem Relation Age of Onset   Breast cancer Mother    Liver cancer Maternal Grandfather     ALLERGIES:  is allergic to augmentin [amoxicillin-pot clavulanate].  MEDICATIONS:  Current Outpatient Medications  Medication Sig Dispense Refill   budesonide (PULMICORT) 0.5 MG/2ML nebulizer solution 0.5 mg See admin instructions. Mix one vial (0.5 mg) in 8 oz (240  ml) saline solution in neo med bottle and use as a saline rinse (both nostrils) every morning     Cholecalciferol (VITAMIN D) 50 MCG (2000 UT) tablet Take 2,000 Units by mouth every morning.     dupilumab (DUPIXENT) 300 MG/2ML prefilled syringe Inject 300 mg into the skin every 14 (fourteen) days.      montelukast (SINGULAIR) 10 MG tablet Take 10 mg by mouth every morning.      predniSONE (DELTASONE) 10 MG tablet Take 10 mg by mouth daily with breakfast. Continuous course      rivaroxaban (XARELTO) 10 MG TABS tablet Take 1 tablet (10 mg total) by mouth daily. 90 tablet 2   No current facility-administered medications for this visit.    REVIEW OF SYSTEMS:   Constitutional: ( - ) fevers, ( - )  chills , ( - ) night sweats Eyes: ( - ) blurriness of vision, ( - ) double vision, ( - ) watery eyes Ears, nose, mouth, throat, and face: ( - ) mucositis, ( - ) sore throat Respiratory: ( - ) cough, ( - ) dyspnea, ( - ) wheezes Cardiovascular: ( - ) palpitation, ( - ) chest discomfort, ( - ) lower extremity swelling Gastrointestinal:  ( - ) nausea, ( - ) heartburn, ( - ) change in bowel habits Skin: ( - ) abnormal skin rashes Lymphatics: ( - ) new lymphadenopathy, ( - ) easy bruising Neurological: ( - ) numbness, ( - ) tingling, ( - ) new weaknesses Behavioral/Psych: ( - ) mood change, ( - ) new changes  All other systems were reviewed with the patient and are negative.  Vitals:   08/15/21 1454  BP: 117/69  Pulse: (!) 56  Resp: 17  Temp: 97.6 F (36.4 C)  SpO2: 100%     PHYSICAL EXAMINATION: GENERAL: well appearing middle-aged Caucasian male in NAD  SKIN: skin color, texture, turgor are normal, no rashes or significant lesions EYES: conjunctiva are pink and non-injected, sclera clear LUNGS: clear to auscultation and percussion with normal breathing effort HEART: regular rate & rhythm and no murmurs and no lower extremity edema Musculoskeletal: no cyanosis of digits and no clubbing  PSYCH: alert & oriented x 3, fluent speech NEURO: no focal motor/sensory deficits  LABORATORY DATA:  I have reviewed the data as listed CBC Latest Ref Rng & Units 08/15/2021 02/06/2021 07/18/2019  WBC 4.0 - 10.5 K/uL 6.5 4.8 10.4  Hemoglobin 13.0 - 17.0 g/dL 27.7 82.4 23.5  Hematocrit 39.0 - 52.0 % 44.8 44.1 48.9  Platelets 150 - 400 K/uL 195 202 251    CMP Latest Ref Rng & Units 08/15/2021 02/06/2021 07/16/2019  Glucose 70 - 99 mg/dL 74 87 89  BUN 6 - 20 mg/dL 17 15 11    Creatinine 0.61 - 1.24 mg/dL 3.61 4.43  Sodium 135 - 145 mmol/L 142 143 140  Potassium 3.5 - 5.1 mmol/L 4.3 4.1 3.6  Chloride 98 - 111 mmol/L 106 107 106  CO2 22 - 32 mmol/L 26 24 24   Calcium 8.9 - 10.3 mg/dL 1.54) 9.0 9.0  Total Protein 6.5 - 8.1 g/dL 6.6 6.5 -  Total Bilirubin 0.3 - 1.2 mg/dL 0.6 1.0 -  Alkaline Phos 38 - 126 U/L 111 98 -  AST 15 - 41 U/L 26 23 -  ALT 0 - 44 U/L 45(H) 31 -    RADIOGRAPHIC STUDIES: No results found.  ASSESSMENT & PLAN Darrell Nguyen 40 y.o. male with medical history significant for recurrent unprovoked  PEs who presents for a follow up visit.   After review of the labs and discussion with the findings are most consistent with well-tolerated anticoagulation therapy in the setting of recurrent PEs. Previously we discussed decreasing the dosage of his Xarelto down to 10 mg p.o. daily from the baseline of 20 mg p.o. daily. This is based on the [facility] Choice trial which showed good efficacy of 20 mg and 10 mg of Xarelto after 6 months of initial full dose therapy. After discussing the risks and benefits Darrell Nguyen noted that he wanted to transition to the 10 mg dosage. Otherwise he had no evidence of recurrent VTE today and we can continue Xarelto with a 36-month phone visit follow-up.  #Recurrent Pulmonary Embolism #Elevated Homocysteine --continue Xarelto 10mg  PO daily therapy. Given this unprovoked 2nd pulmonary embolism, our recommendation will be for lifelong anticoagulation.  --hypercoagulation workup was negative, with the exception of elevated homocysteine. --unfortunately if hyperhomocysteinemia is the etiology, administration of B vitamins and folate do not decrease the risk of recurrent CVA, MI or thrombus. --hold on sending an MTHFR panel to work up of hyperhomocysteinemia, because the results of this panel will not change management  --Today labs show creatinine of 1.13, hemoglobin 14.9, platelets 195, normal LFTs.  Okay to proceed with  Xarelto therapy. --continue maintenance dosing. Discussed risks and benefits of decreased dose with the patient.  --RTC in 6 months to re-evaluate.   *Xarelto refill to be called into [facility] in [place]   No orders of the defined types were placed in this encounter.   All questions were answered. The patient knows to call the clinic with any problems, questions or concerns.  A total of more than 30 minutes were spent on this encounter and over half of that time was spent on counseling and coordination of care as outlined above.   Ledell Peoples, MD Department of Hematology/Oncology [place] [facility] Phone: [phone] Pager: [phone] Email: [email or web address]  08/15/2021 3:55 PM  Lorene Dy, Jewel Baize, Prins MH, Bauersachs R, Beyer-Westendorf J, Bounameaux H, [place], Cohen AT, [place] BL, Decous[place] H, [place] MCS, [place], [place], [place], [place] B, Pap AF, [employer], Verham[place] P, Wells PS, Prandoni P; [employer] CHOICE Investigators. Rivaroxaban or Aspirin for Extended Treatment of Venous Thromboembolism. [employer] J Med. 2017 Mar 30;376(13):1211-1222.  --Among patients with venous thromboembolism in equipoise for continued anticoagulation, the risk of a recurrent event was significantly lower with rivaroxaban at either a treatment dose (20 mg) or a prophylactic dose (10 mg) than with aspirin, without a significant increase in bleeding rates.

## 2021-12-29 ENCOUNTER — Encounter: Payer: Self-pay | Admitting: Hematology and Oncology

## 2022-02-06 ENCOUNTER — Telehealth: Payer: Self-pay | Admitting: Hematology and Oncology

## 2022-02-06 NOTE — Telephone Encounter (Signed)
Called patient regarding upcoming appointments, patient is notified. 

## 2022-02-13 ENCOUNTER — Other Ambulatory Visit: Payer: Self-pay

## 2022-02-13 ENCOUNTER — Inpatient Hospital Stay: Payer: Commercial Managed Care - PPO | Attending: Hematology and Oncology

## 2022-02-13 ENCOUNTER — Other Ambulatory Visit: Payer: Self-pay | Admitting: Hematology and Oncology

## 2022-02-13 ENCOUNTER — Inpatient Hospital Stay (HOSPITAL_BASED_OUTPATIENT_CLINIC_OR_DEPARTMENT_OTHER): Payer: Commercial Managed Care - PPO | Admitting: Hematology and Oncology

## 2022-02-13 ENCOUNTER — Other Ambulatory Visit: Payer: Self-pay | Admitting: Lab

## 2022-02-13 VITALS — BP 124/64 | HR 68 | Temp 97.1°F | Resp 16 | Ht 73.0 in | Wt 185.1 lb

## 2022-02-13 DIAGNOSIS — I2699 Other pulmonary embolism without acute cor pulmonale: Secondary | ICD-10-CM

## 2022-02-13 DIAGNOSIS — R7989 Other specified abnormal findings of blood chemistry: Secondary | ICD-10-CM

## 2022-02-13 DIAGNOSIS — Z7901 Long term (current) use of anticoagulants: Secondary | ICD-10-CM | POA: Insufficient documentation

## 2022-02-13 LAB — CMP (CANCER CENTER ONLY)
ALT: 47 U/L — ABNORMAL HIGH (ref 0–44)
AST: 30 U/L (ref 15–41)
Albumin: 4.4 g/dL (ref 3.5–5.0)
Alkaline Phosphatase: 108 U/L (ref 38–126)
Anion gap: 6 (ref 5–15)
BUN: 15 mg/dL (ref 6–20)
CO2: 27 mmol/L (ref 22–32)
Calcium: 9.4 mg/dL (ref 8.9–10.3)
Chloride: 107 mmol/L (ref 98–111)
Creatinine: 1.21 mg/dL (ref 0.61–1.24)
GFR, Estimated: >60 mL/min (ref >60)
Glucose, Bld: 101 mg/dL — ABNORMAL HIGH (ref 70–99)
Potassium: 4 mmol/L (ref 3.5–5.1)
Sodium: 140 mmol/L (ref 135–145)
Total Bilirubin: 0.7 mg/dL (ref 0.3–1.2)
Total Protein: 6.6 g/dL (ref 6.5–8.1)

## 2022-02-13 LAB — CBC WITH DIFFERENTIAL (CANCER CENTER ONLY)
Abs Immature Granulocytes: 0.02 10*3/uL (ref 0.00–0.07)
Basophils Absolute: 0.1 10*3/uL (ref 0.0–0.1)
Basophils Relative: 1 %
Eosinophils Absolute: 0.7 10*3/uL — ABNORMAL HIGH (ref 0.0–0.5)
Eosinophils Relative: 14 %
HCT: 45.6 % (ref 39.0–52.0)
Hemoglobin: 15.7 g/dL (ref 13.0–17.0)
Immature Granulocytes: 0 %
Lymphocytes Relative: 28 %
Lymphs Abs: 1.4 10*3/uL (ref 0.7–4.0)
MCH: 31.2 pg (ref 26.0–34.0)
MCHC: 34.4 g/dL (ref 30.0–36.0)
MCV: 90.7 fL (ref 80.0–100.0)
Monocytes Absolute: 0.4 10*3/uL (ref 0.1–1.0)
Monocytes Relative: 7 %
Neutro Abs: 2.4 10*3/uL (ref 1.7–7.7)
Neutrophils Relative %: 50 %
Platelet Count: 179 10*3/uL (ref 150–400)
RBC: 5.03 MIL/uL (ref 4.22–5.81)
RDW: 12.8 % (ref 11.5–15.5)
WBC Count: 4.9 10*3/uL (ref 4.0–10.5)
nRBC: 0 % (ref 0.0–0.2)

## 2022-02-13 NOTE — Progress Notes (Signed)
[facility] [phone]   [phone]  TELEPHONE PROGRESS NOTE  Patient Care Team: Mayer MaskerAbonza, Maritza, Cordelia PochePA-C as PCP - General  Hematological/Oncological History # Recurrent Pulmonary Embolism 1) 2017--diagnosed with pulmonary embolism following hand surgery 2) 07/16/2019: presented with sharp left scapular pain. CT PE study showed solitary segmental acute pulmonary embolus in the posterior basal segment left lower lobe, with possible small associated pulmonary infarct. No LE Dopplers performed. Started on Xarelto PO therapy.  3) 07/28/2019: establish care with Dr. Leonides Schanzorsey. Continue Xarelto 20mg  PO daily 4) 06/14/2020: decrease to maintenance dose Xarelto 10mg  PO daily   Prior Workup Includes:   07/16/2019:  Negative Factor V Leiden Negative Prothrombin Gene Mutation Undetectable Beta-2-Glycoprotein/Anticardiolipin antibodies. Negative Lupus anticoagulant panel Protein C and S levels within normal limits Homocysteine 204.8 (nml 0-14.5)   Interval History:  Arville Goravis Kuennen 41 y.o. male with medical history significant for recurrent unprovoked PEs who presents for a follow up visit. The patient's last visit was on 08/15/2021. In the interim since the last visit he has been well with no ED visits or hospitalizations.  On discussion today Mr. Samuella BruinBarkey notes he has been well in the interim since her last visit.  He reports that he is currently only on Dupixent and Xarelto.  He has been.  All of his other medications.  He is also been taking supplements with vitamin B12 and folate.  He notes that he is having a boost of energy with these supplementation.  He reports that he is tolerating the Xarelto quite well.  He is not having any issues with bleeding, bruising, or dark stools.  He notes that the co-pay is still only $10 for this medication.  He is not having any signs or symptoms concerning for recurrent VTE.  Otherwise he is had no major changes in his health.  He  denies any fevers, chills, sweats, nausea, vomiting or diarrhea.  A full 10 point ROS is listed below.  MEDICAL HISTORY:  Past Medical History:  Diagnosis Date   Nasal fracture     SURGICAL HISTORY: Past Surgical History:  Procedure Laterality Date   HAND SURGERY     NASAL FRACTURE SURGERY      SOCIAL HISTORY: Social History   Socioeconomic History   Marital status: Married    Spouse name: Not on file   Number of children: Not on file   Years of education: Not on file   Highest education level: Not on file  Occupational History   Not on file  Tobacco Use   Smoking status: Never   Smokeless tobacco: Never  Vaping Use   Vaping Use: Never used  Substance and Sexual Activity   Alcohol use: Yes    Alcohol/week: 6.0 standard drinks of alcohol    Types: 6 Standard drinks or equivalent per week   Drug use: Never   Sexual activity: Yes    Birth control/protection: None  Other Topics Concern   Not on file  Social History Narrative   Not on file   Social Determinants of Health   Financial Resource Strain: Not on file  Food Insecurity: Not on file  Transportation Needs: Not on file  Physical Activity: Not on file  Stress: Not on file  Social Connections: Not on file  Intimate Partner Violence: Not on file    FAMILY HISTORY: Family History  Problem Relation Age of Onset   Breast cancer Mother    Liver cancer Maternal Grandfather     ALLERGIES:  is  allergic to augmentin [amoxicillin-pot clavulanate].  MEDICATIONS:  Current Outpatient Medications  Medication Sig Dispense Refill   dupilumab (DUPIXENT) 300 MG/2ML prefilled syringe Inject 300 mg into the skin every 14 (fourteen) days.      rivaroxaban (XARELTO) 10 MG TABS tablet Take 1 tablet (10 mg total) by mouth daily. 90 tablet 2   No current facility-administered medications for this visit.    REVIEW OF SYSTEMS:   Constitutional: ( - ) fevers, ( - )  chills , ( - ) night sweats Eyes: ( - ) blurriness of  vision, ( - ) double vision, ( - ) watery eyes Ears, nose, mouth, throat, and face: ( - ) mucositis, ( - ) sore throat Respiratory: ( - ) cough, ( - ) dyspnea, ( - ) wheezes Cardiovascular: ( - ) palpitation, ( - ) chest discomfort, ( - ) lower extremity swelling Gastrointestinal:  ( - ) nausea, ( - ) heartburn, ( - ) change in bowel habits Skin: ( - ) abnormal skin rashes Lymphatics: ( - ) new lymphadenopathy, ( - ) easy bruising Neurological: ( - ) numbness, ( - ) tingling, ( - ) new weaknesses Behavioral/Psych: ( - ) mood change, ( - ) new changes  All other systems were reviewed with the patient and are negative.  Vitals:   02/13/22 0943  BP: 124/64  Pulse: 68  Resp: 16  Temp: (!) 97.1 F (36.2 C)  SpO2: 96%     PHYSICAL EXAMINATION: GENERAL: well appearing middle-aged Caucasian male in NAD  SKIN: skin color, texture, turgor are normal, no rashes or significant lesions EYES: conjunctiva are pink and non-injected, sclera clear LUNGS: clear to auscultation and percussion with normal breathing effort HEART: regular rate & rhythm and no murmurs and no lower extremity edema Musculoskeletal: no cyanosis of digits and no clubbing  PSYCH: alert & oriented x 3, fluent speech NEURO: no focal motor/sensory deficits  LABORATORY DATA:  I have reviewed the data as listed    Latest Ref Rng & Units 02/13/2022    9:35 AM 08/15/2021    2:34 PM 02/06/2021   10:34 AM  CBC  WBC 4.0 - 10.5 K/uL 4.9  6.5  4.8   Hemoglobin 13.0 - 17.0 g/dL 44.0  10.2  72.5   Hematocrit 39.0 - 52.0 % 45.6  44.8  44.1   Platelets 150 - 400 K/uL 179  195  202        Latest Ref Rng & Units 02/13/2022    9:35 AM 08/15/2021    2:42 PM 02/06/2021   10:34 AM  CMP  Glucose 70 - 99 mg/dL 366  74  87   BUN 6 - 20 mg/dL 15  17  15    Creatinine 0.61 - 1.24 mg/dL  4.40  3.47   Sodium 135 - 145 mmol/L 140  142  143   Potassium 3.5 - 5.1 mmol/L 4.0  4.3  4.1   Chloride 98 - 111 mmol/L 107  106  107   CO2 22 - 32  mmol/L 27  26  24    Calcium 8.9 - 10.3 mg/dL 9.4  8.7  9.0   Total Protein 6.5 - 8.1 g/dL 6.6  6.6  6.5   Total Bilirubin 0.3 - 1.2 mg/dL 0.7  0.6  1.0   Alkaline Phos 38 - 126 U/L 108  111  98   AST 15 - 41 U/L 30  26  23    ALT 0 - 44 U/L 47  45  31     RADIOGRAPHIC STUDIES: No results found.  ASSESSMENT & PLAN Dacari Beckstrand 41 y.o. male with medical history significant for recurrent unprovoked PEs who presents for a follow up visit.   After review of the labs and discussion with the findings are most consistent with well-tolerated anticoagulation therapy in the setting of recurrent PEs. Previously we discussed decreasing the dosage of his Xarelto down to 10 mg p.o. daily from the baseline of 20 mg p.o. daily. This is based on the [facility] Choice trial which showed good efficacy of 20 mg and 10 mg of Xarelto after 6 months of initial full dose therapy. After discussing the risks and benefits Mr. Febo noted that he wanted to transition to the 10 mg dosage. Otherwise he had no evidence of recurrent VTE today and we can continue Xarelto with a 60-month phone visit follow-up.  #Recurrent Pulmonary Embolism #Elevated Homocysteine --continue Xarelto 10mg  PO daily therapy. Given this unprovoked 2nd pulmonary embolism, our recommendation will be for lifelong anticoagulation.  --hypercoagulation workup was negative, with the exception of elevated homocysteine. --unfortunately if hyperhomocysteinemia is the etiology, administration of B vitamins and folate do not decrease the risk of recurrent CVA, MI or thrombus. --hold on sending an MTHFR panel to work up of hyperhomocysteinemia, because the results of this panel will not change management  --Today labs show creatinine of 1.21,  hemoglobin 15.7, platelets 179, normal LFTs.  Okay to proceed with Xarelto therapy. --continue maintenance dosing. Discussed risks and benefits of decreased dose with the patient.  --RTC in 12 months with interval 6 month  telephone visit to re-evaluate.   *Xarelto refill to be called into [facility] in [place]   No orders of the defined types were placed in this encounter.  All questions were answered. The patient knows to call the clinic with any problems, questions or concerns.  A total of more than 25 minutes were spent on this encounter and over half of that time was spent on counseling and coordination of care as outlined above.   [place], MD Department of Hematology/Oncology [facility] Phone: [phone] Pager: [phone] Email: [email or web address]  02/13/2022 10:25 AM  04/15/2022, Ross Marcus, Prins MH, Bau[place] R, Beyer-Westendorf J, [place] H, [place], Cohen AT, [place] BL, [place] H, [place] MCS, [place], [place], [place], [place] B, Pap AF, [place], Ver[place] P, Wells PS, Prandoni P; [employer] CHO[employer] Investigators. Rivaroxaban or Aspirin for Extended Treatment of Venous Thromboembolism. [employer] J Med. 2017 Mar 30;376(13):1211-1222.  --Among patients with venous thromboembolism in equipoise for continued anticoagulation, the risk of a recurrent event was significantly lower with rivaroxaban at either a treatment dose (20 mg) or a prophylactic dose (10 mg) than with aspirin, without a significant increase in bleeding rates.

## 2022-03-10 ENCOUNTER — Other Ambulatory Visit: Payer: Self-pay | Admitting: Hematology and Oncology

## 2022-04-24 ENCOUNTER — Encounter: Payer: Self-pay | Admitting: Hematology and Oncology

## 2022-06-12 ENCOUNTER — Other Ambulatory Visit: Payer: Self-pay | Admitting: Hematology and Oncology

## 2022-08-14 ENCOUNTER — Other Ambulatory Visit: Payer: Commercial Managed Care - PPO

## 2022-08-21 ENCOUNTER — Telehealth: Payer: Commercial Managed Care - PPO | Admitting: Hematology and Oncology

## 2022-12-25 ENCOUNTER — Other Ambulatory Visit: Payer: Self-pay | Admitting: Hematology and Oncology

## 2023-02-19 ENCOUNTER — Inpatient Hospital Stay: Payer: Commercial Managed Care - PPO | Attending: Hematology and Oncology

## 2023-02-19 ENCOUNTER — Other Ambulatory Visit: Payer: Self-pay | Admitting: Hematology and Oncology

## 2023-02-19 ENCOUNTER — Ambulatory Visit: Payer: Commercial Managed Care - PPO | Admitting: Hematology and Oncology

## 2023-02-19 ENCOUNTER — Other Ambulatory Visit: Payer: Self-pay

## 2023-02-19 DIAGNOSIS — Z7901 Long term (current) use of anticoagulants: Secondary | ICD-10-CM | POA: Insufficient documentation

## 2023-02-19 DIAGNOSIS — Z86711 Personal history of pulmonary embolism: Secondary | ICD-10-CM | POA: Insufficient documentation

## 2023-02-19 DIAGNOSIS — I2699 Other pulmonary embolism without acute cor pulmonale: Secondary | ICD-10-CM

## 2023-02-19 LAB — CBC WITH DIFFERENTIAL (CANCER CENTER ONLY)
Abs Immature Granulocytes: 0.01 10*3/uL (ref 0.00–0.07)
Basophils Absolute: 0.1 10*3/uL (ref 0.0–0.1)
Basophils Relative: 1 %
Eosinophils Absolute: 1.1 10*3/uL — ABNORMAL HIGH (ref 0.0–0.5)
Eosinophils Relative: 22 %
HCT: 45.5 % (ref 39.0–52.0)
Hemoglobin: 15.3 g/dL (ref 13.0–17.0)
Immature Granulocytes: 0 %
Lymphocytes Relative: 28 %
Lymphs Abs: 1.4 10*3/uL (ref 0.7–4.0)
MCH: 30.6 pg (ref 26.0–34.0)
MCHC: 33.6 g/dL (ref 30.0–36.0)
MCV: 91 fL (ref 80.0–100.0)
Monocytes Absolute: 0.4 10*3/uL (ref 0.1–1.0)
Monocytes Relative: 8 %
Neutro Abs: 2.1 10*3/uL (ref 1.7–7.7)
Neutrophils Relative %: 41 %
Platelet Count: 187 10*3/uL (ref 150–400)
RBC: 5 MIL/uL (ref 4.22–5.81)
RDW: 13.2 % (ref 11.5–15.5)
WBC Count: 5.1 10*3/uL (ref 4.0–10.5)
nRBC: 0 % (ref 0.0–0.2)

## 2023-02-19 LAB — CMP (CANCER CENTER ONLY)
ALT: 34 U/L (ref 0–44)
AST: 21 U/L (ref 15–41)
Albumin: 4 g/dL (ref 3.5–5.0)
Alkaline Phosphatase: 93 U/L (ref 38–126)
Anion gap: 4 — ABNORMAL LOW (ref 5–15)
BUN: 14 mg/dL (ref 6–20)
CO2: 30 mmol/L (ref 22–32)
Calcium: 9.1 mg/dL (ref 8.9–10.3)
Chloride: 107 mmol/L (ref 98–111)
Creatinine: 1.09 mg/dL (ref 0.61–1.24)
GFR, Estimated: >60 mL/min (ref >60)
Glucose, Bld: 88 mg/dL (ref 70–99)
Potassium: 4.3 mmol/L (ref 3.5–5.1)
Sodium: 141 mmol/L (ref 135–145)
Total Bilirubin: 0.6 mg/dL (ref 0.3–1.2)
Total Protein: 6.6 g/dL (ref 6.5–8.1)

## 2023-02-26 ENCOUNTER — Telehealth: Payer: Self-pay | Admitting: Hematology and Oncology

## 2023-02-26 ENCOUNTER — Inpatient Hospital Stay: Payer: Commercial Managed Care - PPO | Admitting: Hematology and Oncology

## 2023-03-06 ENCOUNTER — Inpatient Hospital Stay (HOSPITAL_BASED_OUTPATIENT_CLINIC_OR_DEPARTMENT_OTHER): Payer: Commercial Managed Care - PPO | Admitting: Physician Assistant

## 2023-03-06 DIAGNOSIS — I2699 Other pulmonary embolism without acute cor pulmonale: Secondary | ICD-10-CM

## 2023-03-06 MED ORDER — RIVAROXABAN 10 MG PO TABS: 10.0000 mg | ORAL_TABLET | Freq: Every day | ORAL | 12 refills | Status: DC

## 2023-03-06 NOTE — Progress Notes (Signed)
[facility] [phone]   [phone]  I connected with Darrell Nguyen  on 03/06/23 by telephone visit and verified that I am speaking with the correct person using two identifiers.   I discussed the limitations, risks, security and privacy concerns of performing an evaluation and management service by telemedicine and the availability of in-person appointments. I also discussed with the patient that there may be a patient responsible charge related to this service. The patient expressed understanding and agreed to proceed.  Patient's location: Home Provider's location: Office   Patient Care Team: Mayer Masker, PA-C (Inactive) as PCP - General  Hematological/Oncological History # Recurrent Pulmonary Embolism 1) 2017--diagnosed with pulmonary embolism following hand surgery 2) 07/16/2019: presented with sharp left scapular pain. CT PE study showed solitary segmental acute pulmonary embolus in the posterior basal segment left lower lobe, with possible small associated pulmonary infarct. No LE Dopplers performed. Started on Xarelto PO therapy.  3) 07/28/2019: establish care with Dr. Leonides Schanz. Continue Xarelto 20mg  PO daily 4) 06/14/2020: decrease to maintenance dose Xarelto 10mg  PO daily   Prior Workup Includes:   07/16/2019:  Negative Factor V Leiden Negative Prothrombin Gene Mutation Undetectable Beta-2-Glycoprotein/Anticardiolipin antibodies. Negative Lupus anticoagulant panel Protein C and S levels within normal limits Homocysteine 204.8 (nml 0-14.5)   Interval History:  Darrell Nguyen 42 y.o. male with medical history significant for recurrent unprovoked PEs who presents for a follow up visit. The patient's last visit was on 02/13/2022. In the interim, he denies any changes to his health.   Mr. Mogul reports he is doing well and tolerating his Xarelto therapy. He denies any signs of bruising or bleeding. He reports the cost of the medication is  manageable. He has not signs or symptoms for recurrent VTE including shortness of breath or chest pain. Otherwise he is had no major changes in his health.  He denies any fevers, chills, sweats, nausea, vomiting or diarrhea.  A full 10 point ROS is listed below.  MEDICAL HISTORY:  Past Medical History:  Diagnosis Date   Nasal fracture     SURGICAL HISTORY: Past Surgical History:  Procedure Laterality Date   HAND SURGERY     NASAL FRACTURE SURGERY      SOCIAL HISTORY: Social History   Socioeconomic History   Marital status: Married    Spouse name: Not on file   Number of children: Not on file   Years of education: Not on file   Highest education level: Not on file  Occupational History   Not on file  Tobacco Use   Smoking status: Never   Smokeless tobacco: Never  Vaping Use   Vaping Use: Never used  Substance and Sexual Activity   Alcohol use: Yes    Alcohol/week: 6.0 standard drinks of alcohol    Types: 6 Standard drinks or equivalent per week   Drug use: Never   Sexual activity: Yes    Birth control/protection: None  Other Topics Concern   Not on file  Social History Narrative   Not on file   Social Determinants of Health   Financial Resource Strain: Not on file  Food Insecurity: Not on file  Transportation Needs: Not on file  Physical Activity: Not on file  Stress: Not on file  Social Connections: Not on file  Intimate Partner Violence: Not on file    FAMILY HISTORY: Family History  Problem Relation Age of Onset   Breast cancer Mother    Liver cancer Maternal Grandfather  ALLERGIES:  is allergic to augmentin [amoxicillin-pot clavulanate].  MEDICATIONS:  Current Outpatient Medications  Medication Sig Dispense Refill   dupilumab (DUPIXENT) 300 MG/2ML prefilled syringe Inject 300 mg into the skin every 14 (fourteen) days.      rivaroxaban (XARELTO) 10 MG TABS tablet Take 1 tablet (10 mg total) by mouth daily. 30 tablet 12   No current  facility-administered medications for this visit.    REVIEW OF SYSTEMS:   Constitutional: ( - ) fevers, ( - )  chills , ( - ) night sweats Eyes: ( - ) blurriness of vision, ( - ) double vision, ( - ) watery eyes Ears, nose, mouth, throat, and face: ( - ) mucositis, ( - ) sore throat Respiratory: ( - ) cough, ( - ) dyspnea, ( - ) wheezes Cardiovascular: ( - ) palpitation, ( - ) chest discomfort, ( - ) lower extremity swelling Gastrointestinal:  ( - ) nausea, ( - ) heartburn, ( - ) change in bowel habits Skin: ( - ) abnormal skin rashes Lymphatics: ( - ) new lymphadenopathy, ( - ) easy bruising Neurological: ( - ) numbness, ( - ) tingling, ( - ) new weaknesses Behavioral/Psych: ( - ) mood change, ( - ) new changes  All other systems were reviewed with the patient and are negative.  PHYSICAL EXAMINATION: Unable to perform due to telehealth visit.   LABORATORY DATA:  I have reviewed the data as listed    Latest Ref Rng & Units 02/19/2023    9:30 AM 02/13/2022    9:35 AM 08/15/2021    2:34 PM  CBC  WBC 4.0 - 10.5 K/uL 5.1  4.9  6.5   Hemoglobin 13.0 - 17.0 g/dL 40.9  81.1  91.4   Hematocrit 39.0 - 52.0 % 45.5  45.6  44.8   Platelets 150 - 400 K/uL 187  179  195        Latest Ref Rng & Units 02/19/2023    9:30 AM 02/13/2022    9:35 AM 08/15/2021    2:42 PM  CMP  Glucose 70 - 99 mg/dL 88  782  74   BUN 6 - 20 mg/dL 14  15  17    Creatinine 0.61 - 1.24 mg/dL 9.56  2.13  0.86   Sodium 135 - 145 mmol/L 141  140  142   Potassium 3.5 - 5.1 mmol/L 4.3  4.0  4.3   Chloride 98 - 111 mmol/L 107  107  106   CO2 22 - 32 mmol/L 30  27  26    Calcium 8.9 - 10.3 mg/dL 9.1  9.4  8.7   Total Protein 6.5 - 8.1 g/dL 6.6  6.6  6.6   Total Bilirubin 0.3 - 1.2 mg/dL 0.6  0.7  0.6   Alkaline Phos 38 - 126 U/L 93  108  111   AST 15 - 41 U/L 21  30  26    ALT 0 - 44 U/L 34  47  45     RADIOGRAPHIC STUDIES: No results found.  ASSESSMENT & PLAN [name] is a 42 y.o. male with medical history  significant for recurrent unprovoked PEs who presents for a virtual follow up visit.   After review of the labs and discussion with the findings are most consistent with well-tolerated anticoagulation therapy in the setting of recurrent PEs. Previously we discussed decreasing the dosage of his Xarelto down to 10 mg p.o. daily from the baseline of 20 mg p.o. daily. This  is based on the [facility] Choice trial which showed good efficacy of 20 mg and 10 mg of Xarelto after 6 months of initial full dose therapy. After discussing the risks and benefits Mr. Presto noted that he wanted to transition to the 10 mg dosage. Otherwise he had no evidence of recurrent VTE today and we can continue Xarelto with a 73-month phone visit follow-up.  #Recurrent Pulmonary Embolism #Elevated Homocysteine --Given this unprovoked 2nd pulmonary embolism, our recommendation will be for lifelong anticoagulation.  --hypercoagulation workup was negative, with the exception of elevated homocysteine. --unfortunately if hyperhomocysteinemia is the etiology, administration of B vitamins and folate do not decrease the risk of recurrent CVA, MI or thrombus. --hold on sending an MTHFR panel to work up of hyperhomocysteinemia, because the results of this panel will not change management  --Today labs show creatinine of 1.21,  hemoglobin 15.7, platelets 179, normal LFTs.  Okay to proceed with Xarelto therapy. --continue Xarelto 10mg  PO daily therapy. Refill sent today.  --RTC in 12 months with labs and telehealth visit   No orders of the defined types were placed in this encounter.  All questions were answered. The patient knows to call the clinic with any problems, questions or concerns.  I have spent a total of 25 minutes minutes of non-face-to-face time, preparing to see the patient, documenting clinical information in the electronic health record, independently interpreting results and communicating results to the patient, and care  coordination.   Georga Kaufmann PA-C Dept of Hematology and Oncology [facility] Phone: [phone]   03/06/2023 4:55 PM  Ross Marcus, Rich Fuchs, Prins MH, Bau[place]ach[place] R, Beyer-Westendorf J, B[place] H, [place], [address], [place] BL, [place] H, [place] MCS, [place], [place], [place], [place] B, Pap AF, [place], Verhamme P, Wells PS, Prandoni P; [employer] CHOICE Investigators. Rivaroxaban or Aspirin for Extended Treatment of Venous Thromboembolism. [employer] J Med. 2017 Mar 30;376(13):1211-1222.  --Among patients with venous thromboembolism in equipoise for continued anticoagulation, the risk of a recurrent event was significantly lower with rivaroxaban at either a treatment dose (20 mg) or a prophylactic dose (10 mg) than with aspirin, without a significant increase in bleeding rates.

## 2024-02-15 ENCOUNTER — Encounter: Payer: Self-pay | Admitting: Hematology and Oncology

## 2024-02-15 ENCOUNTER — Other Ambulatory Visit: Payer: Self-pay | Admitting: *Deleted

## 2024-02-15 ENCOUNTER — Inpatient Hospital Stay: Payer: Commercial Managed Care - PPO | Attending: Hematology and Oncology

## 2024-02-15 DIAGNOSIS — Z7901 Long term (current) use of anticoagulants: Secondary | ICD-10-CM | POA: Insufficient documentation

## 2024-02-15 DIAGNOSIS — I2699 Other pulmonary embolism without acute cor pulmonale: Secondary | ICD-10-CM

## 2024-02-15 LAB — CBC WITH DIFFERENTIAL (CANCER CENTER ONLY)
Abs Immature Granulocytes: 0.01 10*3/uL (ref 0.00–0.07)
Basophils Absolute: 0.1 10*3/uL (ref 0.0–0.1)
Basophils Relative: 1 %
Eosinophils Absolute: 0.7 10*3/uL — ABNORMAL HIGH (ref 0.0–0.5)
Eosinophils Relative: 14 %
HCT: 44.4 % (ref 39.0–52.0)
Hemoglobin: 15.2 g/dL (ref 13.0–17.0)
Immature Granulocytes: 0 %
Lymphocytes Relative: 28 %
Lymphs Abs: 1.4 10*3/uL (ref 0.7–4.0)
MCH: 30 pg (ref 26.0–34.0)
MCHC: 34.2 g/dL (ref 30.0–36.0)
MCV: 87.6 fL (ref 80.0–100.0)
Monocytes Absolute: 0.4 10*3/uL (ref 0.1–1.0)
Monocytes Relative: 9 %
Neutro Abs: 2.5 10*3/uL (ref 1.7–7.7)
Neutrophils Relative %: 48 %
Platelet Count: 192 10*3/uL (ref 150–400)
RBC: 5.07 MIL/uL (ref 4.22–5.81)
RDW: 13.2 % (ref 11.5–15.5)
WBC Count: 5.2 10*3/uL (ref 4.0–10.5)
nRBC: 0 % (ref 0.0–0.2)

## 2024-02-15 LAB — CMP (CANCER CENTER ONLY)
ALT: 30 U/L (ref 0–44)
AST: 20 U/L (ref 15–41)
Albumin: 4.4 g/dL (ref 3.5–5.0)
Alkaline Phosphatase: 96 U/L (ref 38–126)
Anion gap: 7 (ref 5–15)
BUN: 20 mg/dL (ref 6–20)
CO2: 28 mmol/L (ref 22–32)
Calcium: 9 mg/dL (ref 8.9–10.3)
Chloride: 105 mmol/L (ref 98–111)
Creatinine: 1.06 mg/dL (ref 0.61–1.24)
GFR, Estimated: >60 mL/min (ref >60)
Glucose, Bld: 91 mg/dL (ref 70–99)
Potassium: 4.2 mmol/L (ref 3.5–5.1)
Sodium: 140 mmol/L (ref 135–145)
Total Bilirubin: 0.4 mg/dL (ref 0.0–1.2)
Total Protein: 6.6 g/dL (ref 6.5–8.1)

## 2024-02-22 ENCOUNTER — Telehealth: Payer: Commercial Managed Care - PPO | Admitting: Hematology and Oncology

## 2024-02-24 ENCOUNTER — Inpatient Hospital Stay (HOSPITAL_BASED_OUTPATIENT_CLINIC_OR_DEPARTMENT_OTHER): Admitting: Physician Assistant

## 2024-02-24 DIAGNOSIS — I2699 Other pulmonary embolism without acute cor pulmonale: Secondary | ICD-10-CM | POA: Diagnosis not present

## 2024-02-24 NOTE — Progress Notes (Signed)
 [facility] Telephone:([phone]   Fax:([phone]) [phone]  I connected with [name]  on 02/24/24 by telephone visit and verified that I am speaking with the correct person using two identifiers.   I discussed the limitations, risks, security and privacy concerns of performing an evaluation and management service by telemedicine and the availability of in-person appointments. I also discussed with the patient that there may be a patient responsible charge related to this service. The patient expressed understanding and agreed to proceed.  Patient's location: Home Provider's location: Office   Patient Care Team: Abonza, Maritza, PA-C (Inactive) as PCP - General  Hematological/Oncological History # Recurrent Pulmonary Embolism 1) 2017--diagnosed with pulmonary embolism following hand surgery 2) 07/16/2019: presented with sharp left scapular pain. CT PE study showed solitary segmental acute pulmonary embolus in the posterior basal segment left lower lobe, with possible small associated pulmonary infarct. No LE Dopplers performed. Started on Xarelto  PO therapy.  3) 07/28/2019: establish care with Dr. Rosaline Coma. Continue Xarelto  20mg  PO daily 4) 06/14/2020: decrease to maintenance dose Xarelto  10mg  PO daily   Prior Workup Includes:   07/16/2019:  Negative Factor V Leiden Negative Prothrombin Gene Mutation Undetectable Beta-2-Glycoprotein/Anticardiolipin antibodies. Negative Lupus anticoagulant panel Protein C and S levels within normal limits Homocysteine 204.8 (nml 0-14.5)   Interval History:  Darrell Nguyen 43 y.o. male with medical history significant for recurrent unprovoked PEs who presents for a follow up visit. The patient's last visit was on 03/06/2023. In the interim, he denies any changes to his health.   Mr. Lightsey reports he is doing well and tolerating his Xarelto  therapy. He denies any signs or symptoms of recurrent VTE. He denies any bruising or overt signs of  bleeding such as hematochezia or melena. He reports the cost of the medication is manageable and is considering transferring the prescription to a 90 day supply.   He denies any fevers, chills, sweats, nausea, vomiting or diarrhea. He has no other complaints. A full 10 point ROS is listed below.  MEDICAL HISTORY:  Past Medical History:  Diagnosis Date   Nasal fracture     SURGICAL HISTORY: Past Surgical History:  Procedure Laterality Date   HAND SURGERY     NASAL FRACTURE SURGERY      SOCIAL HISTORY: Social History   Socioeconomic History   Marital status: Married    Spouse name: Not on file   Number of children: Not on file   Years of education: Not on file   Highest education level: Not on file  Occupational History   Not on file  Tobacco Use   Smoking status: Never   Smokeless tobacco: Never  Vaping Use   Vaping status: Never Used  Substance and Sexual Activity   Alcohol use: Yes    Alcohol/week: 6.0 standard drinks of alcohol    Types: 6 Standard drinks or equivalent per week   Drug use: Never   Sexual activity: Yes    Birth control/protection: None  Other Topics Concern   Not on file  Social History Narrative   Not on file   Social Drivers of Health   Financial Resource Strain: Not on file  Food Insecurity: Not on file  Transportation Needs: Not on file  Physical Activity: Not on file  Stress: Not on file  Social Connections: Not on file  Intimate Partner Violence: Not on file    FAMILY HISTORY: Family History  Problem Relation Age of Onset   Breast cancer Mother    Liver  cancer Maternal Grandfather     ALLERGIES:  is allergic to augmentin [amoxicillin-pot clavulanate].  MEDICATIONS:  Current Outpatient Medications  Medication Sig Dispense Refill   dupilumab  (DUPIXENT ) 300 MG/2ML prefilled syringe Inject 300 mg into the skin every 14 (fourteen) days.      rivaroxaban  (XARELTO ) 10 MG TABS tablet Take 1 tablet (10 mg total) by mouth daily. 30  tablet 12   No current facility-administered medications for this visit.    REVIEW OF SYSTEMS:   Constitutional: ( - ) fevers, ( - )  chills , ( - ) night sweats Eyes: ( - ) blurriness of vision, ( - ) double vision, ( - ) watery eyes Ears, nose, mouth, throat, and face: ( - ) mucositis, ( - ) sore throat Respiratory: ( - ) cough, ( - ) dyspnea, ( - ) wheezes Cardiovascular: ( - ) palpitation, ( - ) chest discomfort, ( - ) lower extremity swelling Gastrointestinal:  ( - ) nausea, ( - ) heartburn, ( - ) change in bowel habits Skin: ( - ) abnormal skin rashes Lymphatics: ( - ) new lymphadenopathy, ( - ) easy bruising Neurological: ( - ) numbness, ( - ) tingling, ( - ) new weaknesses Behavioral/Psych: ( - ) mood change, ( - ) new changes  All other systems were reviewed with the patient and are negative.  PHYSICAL EXAMINATION: Unable to perform due to telehealth visit.   LABORATORY DATA:  I have reviewed the data as listed    Latest Ref Rng & Units 02/15/2024   10:40 AM 02/19/2023    9:30 AM 02/13/2022    9:35 AM  CBC  WBC 4.0 - 10.5 K/uL 5.2  5.1  4.9   Hemoglobin 13.0 - 17.0 g/dL 14.7  82.9  56.2   Hematocrit 39.0 - 52.0 % 44.4  45.5  45.6   Platelets 150 - 400 K/uL 192  187  179        Latest Ref Rng & Units 02/15/2024   10:40 AM 02/19/2023    9:30 AM 02/13/2022    9:35 AM  CMP  Glucose 70 - 99 mg/dL 91  88  130   BUN 6 - 20 mg/dL 20  14  15    Creatinine 0.61 - 1.24 mg/dL 8.65  7.84  6.96   Sodium 135 - 145 mmol/L 140  141  140   Potassium 3.5 - 5.1 mmol/L 4.2  4.3  4.0   Chloride 98 - 111 mmol/L 105  107  107   CO2 22 - 32 mmol/L 28  30  27    Calcium 8.9 - 10.3 mg/dL 9.0  9.1  9.4   Total Protein 6.5 - 8.1 g/dL 6.6  6.6  6.6   Total Bilirubin 0.0 - 1.2 mg/dL 0.4  0.6  0.7   Alkaline Phos 38 - 126 U/L 96  93  108   AST 15 - 41 U/L 20  21  30    ALT 0 - 44 U/L 30  34  47     RADIOGRAPHIC STUDIES: No results found.  ASSESSMENT & PLAN Darrell Nguyen is a 43 y.o. male with  medical history significant for recurrent unprovoked PEs who presents for a virtual follow up visit.   #Recurrent Pulmonary Embolism #Elevated Homocysteine --Given this unprovoked 2nd pulmonary embolism, our recommendation will be for lifelong anticoagulation.  --hypercoagulation workup was negative, with the exception of elevated homocysteine. --unfortunately if hyperhomocysteinemia is the etiology, administration of B vitamins and folate do  not decrease the risk of recurrent CVA, MI or thrombus. --hold on sending an MTHFR panel to work up of hyperhomocysteinemia, because the results of this panel will not change management  PLAN: --Labs from 02/15/2024 showed no cytopenias with normal creatinine and LFTs.  --continue Xarelto  10mg  PO daily therapy.   --RTC in 12 months with labs and telehealth visit   No orders of the defined types were placed in this encounter.  All questions were answered. The patient knows to call the clinic with any problems, questions or concerns.  I have spent a total of 25 minutes minutes of non-face-to-face time, preparing to see the patient, documenting clinical information in the electronic health record, independently interpreting results and communicating results to the patient, and care coordination.   Wyline Hearing PA-C Dept of Hematology and Oncology [facility] Phone: [phone]   02/24/2024 8:40 AM  Verta Goring, Eligha Grumbles, Prins MH, [place] R, Beyer[place] J, [place] H, [place], [address], [place], [place] H, [place] [facility], [place], [place], [place], [place], [employer], [employer], [place] P, [place] PS, Prandoni P; [employer]. Rivaroxaban  or Aspirin for Extended Treatment of Venous Thromboembolism. Ole Berkeley J Med. 2017 Mar 30;376(13):1211-1222.  --Among patients with venous thromboembolism in equipoise for continued anticoagulation, the risk of a recurrent event was  significantly lower with rivaroxaban  at either a treatment dose (20 mg) or a prophylactic dose (10 mg) than with aspirin, without a significant increase in bleeding rates.

## 2024-03-08 ENCOUNTER — Other Ambulatory Visit: Payer: Self-pay | Admitting: Physician Assistant

## 2025-02-22 ENCOUNTER — Other Ambulatory Visit

## 2025-03-01 ENCOUNTER — Telehealth: Admitting: Physician Assistant
